# Patient Record
Sex: Female | Born: 1935 | Race: White | Hispanic: No | Smoking: Never smoker
Health system: Southern US, Community
[De-identification: ages and names within clinical notes are randomized; demographics above are authoritative.]

## PROBLEM LIST (undated history)

## (undated) DIAGNOSIS — G20A1 Parkinson's disease without dyskinesia, without mention of fluctuations: Secondary | ICD-10-CM

## (undated) DIAGNOSIS — G20 Parkinson's disease: Secondary | ICD-10-CM

## (undated) DIAGNOSIS — K922 Gastrointestinal hemorrhage, unspecified: Secondary | ICD-10-CM

## (undated) DIAGNOSIS — K279 Peptic ulcer, site unspecified, unspecified as acute or chronic, without hemorrhage or perforation: Secondary | ICD-10-CM

## (undated) HISTORY — PX: ESOPHAGOGASTRODUODENOSCOPY: SHX1529

---

## 2015-03-02 ENCOUNTER — Encounter (HOSPITAL_COMMUNITY): Payer: Self-pay | Admitting: Emergency Medicine

## 2015-03-02 ENCOUNTER — Emergency Department (HOSPITAL_COMMUNITY): Payer: Medicare Other

## 2015-03-02 ENCOUNTER — Inpatient Hospital Stay (HOSPITAL_COMMUNITY)
Admission: EM | Admit: 2015-03-02 | Discharge: 2015-03-08 | DRG: 065 | Disposition: A | Discharge status: Home Health | Payer: Medicare Other | Attending: Internal Medicine | Admitting: Internal Medicine

## 2015-03-02 DIAGNOSIS — I459 Conduction disorder, unspecified: Secondary | ICD-10-CM | POA: Diagnosis not present

## 2015-03-02 DIAGNOSIS — Z7982 Long term (current) use of aspirin: Secondary | ICD-10-CM

## 2015-03-02 DIAGNOSIS — I63312 Cerebral infarction due to thrombosis of left middle cerebral artery: Secondary | ICD-10-CM | POA: Diagnosis not present

## 2015-03-02 DIAGNOSIS — Q211 Atrial septal defect: Secondary | ICD-10-CM | POA: Diagnosis not present

## 2015-03-02 DIAGNOSIS — I639 Cerebral infarction, unspecified: Secondary | ICD-10-CM | POA: Diagnosis not present

## 2015-03-02 DIAGNOSIS — R29707 NIHSS score 7: Secondary | ICD-10-CM | POA: Diagnosis present

## 2015-03-02 DIAGNOSIS — Z800 Family history of malignant neoplasm of digestive organs: Secondary | ICD-10-CM | POA: Diagnosis not present

## 2015-03-02 DIAGNOSIS — I10 Essential (primary) hypertension: Secondary | ICD-10-CM | POA: Diagnosis present

## 2015-03-02 DIAGNOSIS — Z66 Do not resuscitate: Secondary | ICD-10-CM | POA: Diagnosis present

## 2015-03-02 DIAGNOSIS — I63412 Cerebral infarction due to embolism of left middle cerebral artery: Secondary | ICD-10-CM | POA: Diagnosis present

## 2015-03-02 DIAGNOSIS — R4182 Altered mental status, unspecified: Secondary | ICD-10-CM | POA: Diagnosis present

## 2015-03-02 DIAGNOSIS — R29703 NIHSS score 3: Secondary | ICD-10-CM | POA: Diagnosis not present

## 2015-03-02 DIAGNOSIS — Z801 Family history of malignant neoplasm of trachea, bronchus and lung: Secondary | ICD-10-CM | POA: Diagnosis not present

## 2015-03-02 DIAGNOSIS — G20A1 Parkinson's disease without dyskinesia, without mention of fluctuations: Secondary | ICD-10-CM | POA: Diagnosis present

## 2015-03-02 DIAGNOSIS — R471 Dysarthria and anarthria: Secondary | ICD-10-CM | POA: Diagnosis present

## 2015-03-02 DIAGNOSIS — K279 Peptic ulcer, site unspecified, unspecified as acute or chronic, without hemorrhage or perforation: Secondary | ICD-10-CM | POA: Diagnosis present

## 2015-03-02 DIAGNOSIS — G20 Parkinson's disease: Secondary | ICD-10-CM | POA: Diagnosis present

## 2015-03-02 DIAGNOSIS — K219 Gastro-esophageal reflux disease without esophagitis: Secondary | ICD-10-CM | POA: Diagnosis present

## 2015-03-02 DIAGNOSIS — R490 Dysphonia: Secondary | ICD-10-CM | POA: Diagnosis not present

## 2015-03-02 DIAGNOSIS — R479 Unspecified speech disturbances: Secondary | ICD-10-CM | POA: Diagnosis present

## 2015-03-02 DIAGNOSIS — I340 Nonrheumatic mitral (valve) insufficiency: Secondary | ICD-10-CM | POA: Diagnosis not present

## 2015-03-02 DIAGNOSIS — R4701 Aphasia: Secondary | ICD-10-CM | POA: Diagnosis present

## 2015-03-02 HISTORY — DX: Parkinson's disease without dyskinesia, without mention of fluctuations: G20.A1

## 2015-03-02 HISTORY — DX: Parkinson's disease: G20

## 2015-03-02 HISTORY — DX: Gastrointestinal hemorrhage, unspecified: K92.2

## 2015-03-02 HISTORY — DX: Peptic ulcer, site unspecified, unspecified as acute or chronic, without hemorrhage or perforation: K27.9

## 2015-03-02 LAB — CBC WITH DIFFERENTIAL/PLATELET
BASOS PCT: 1 %
Basophils Absolute: 0.1 10*3/uL (ref 0.0–0.1)
EOS ABS: 0.1 10*3/uL (ref 0.0–0.7)
EOS PCT: 1 %
HEMATOCRIT: 40 % (ref 36.0–46.0)
Hemoglobin: 13.4 g/dL (ref 12.0–15.0)
Lymphocytes Relative: 25 %
Lymphs Abs: 1.8 10*3/uL (ref 0.7–4.0)
MCH: 32 pg (ref 26.0–34.0)
MCHC: 33.5 g/dL (ref 30.0–36.0)
MCV: 95.5 fL (ref 78.0–100.0)
MONO ABS: 0.5 10*3/uL (ref 0.1–1.0)
MONOS PCT: 7 %
NEUTROS ABS: 4.6 10*3/uL (ref 1.7–7.7)
Neutrophils Relative %: 66 %
PLATELETS: 228 10*3/uL (ref 150–400)
RBC: 4.19 MIL/uL (ref 3.87–5.11)
RDW: 12.2 % (ref 11.5–15.5)
WBC: 6.9 10*3/uL (ref 4.0–10.5)

## 2015-03-02 LAB — COMPREHENSIVE METABOLIC PANEL
ALBUMIN: 4 g/dL (ref 3.5–5.0)
ALK PHOS: 69 U/L (ref 38–126)
ALT: 9 U/L — AB (ref 14–54)
ANION GAP: 9 (ref 5–15)
AST: 18 U/L (ref 15–41)
BILIRUBIN TOTAL: 1.2 mg/dL (ref 0.3–1.2)
BUN: 14 mg/dL (ref 6–20)
CALCIUM: 9.2 mg/dL (ref 8.9–10.3)
CO2: 26 mmol/L (ref 22–32)
CREATININE: 0.67 mg/dL (ref 0.44–1.00)
Chloride: 106 mmol/L (ref 101–111)
GFR calc Af Amer: >60 mL/min (ref >60)
GFR calc non Af Amer: >60 mL/min (ref >60)
GLUCOSE: 89 mg/dL (ref 65–99)
Potassium: 4 mmol/L (ref 3.5–5.1)
Sodium: 141 mmol/L (ref 135–145)
TOTAL PROTEIN: 6.9 g/dL (ref 6.5–8.1)

## 2015-03-02 LAB — I-STAT TROPONIN, ED: Troponin i, poc: 0 ng/mL (ref 0.00–0.08)

## 2015-03-02 MED ORDER — PANTOPRAZOLE SODIUM 40 MG IV SOLR
40.0000 mg | INTRAVENOUS | Status: DC
  Administered 2015-03-03 – 2015-03-04 (×2): 40 mg via INTRAVENOUS
  Filled 2015-03-02 (×2): qty 40

## 2015-03-02 MED ORDER — STROKE: EARLY STAGES OF RECOVERY BOOK
Freq: Once | Status: DC
  Filled 2015-03-02: qty 1

## 2015-03-02 MED ORDER — CARBIDOPA-LEVODOPA 25-100 MG PO TABS
1.0000 | ORAL_TABLET | Freq: Every day | ORAL | Status: DC
  Administered 2015-03-03 – 2015-03-08 (×6): 1 via ORAL
  Filled 2015-03-02 (×6): qty 1

## 2015-03-02 MED ORDER — ASPIRIN 325 MG PO TABS
325.0000 mg | ORAL_TABLET | Freq: Every day | ORAL | Status: DC
  Administered 2015-03-03 – 2015-03-08 (×6): 325 mg via ORAL
  Filled 2015-03-02 (×6): qty 1

## 2015-03-02 MED ORDER — HEPARIN SODIUM (PORCINE) 5000 UNIT/ML IJ SOLN
5000.0000 [IU] | Freq: Three times a day (TID) | INTRAMUSCULAR | Status: DC
  Administered 2015-03-03 (×3): 5000 [IU] via SUBCUTANEOUS
  Filled 2015-03-02 (×5): qty 1

## 2015-03-02 MED ORDER — ASPIRIN 300 MG RE SUPP: 300.0000 mg | Freq: Every day | RECTAL | Status: DC

## 2015-03-02 MED ORDER — ASPIRIN 81 MG PO CHEW
324.0000 mg | CHEWABLE_TABLET | Freq: Once | ORAL | Status: AC
  Administered 2015-03-02: 324 mg via ORAL
  Filled 2015-03-02: qty 4

## 2015-03-02 MED ORDER — SENNOSIDES-DOCUSATE SODIUM 8.6-50 MG PO TABS: 1.0000 | ORAL_TABLET | Freq: Every evening | ORAL | Status: DC | PRN

## 2015-03-02 MED ORDER — CHOLECALCIFEROL 10 MCG (400 UNIT) PO TABS
400.0000 [IU] | ORAL_TABLET | Freq: Every day | ORAL | Status: DC
  Administered 2015-03-03 – 2015-03-08 (×6): 400 [IU] via ORAL
  Filled 2015-03-02 (×6): qty 1

## 2015-03-02 NOTE — ED Notes (Signed)
EKG given to EDP,Messner,MD., for review. 

## 2015-03-02 NOTE — Progress Notes (Signed)
Patient presents to Ed for "having a hard time getting her words out."  [facility].  Patient lives at home alone.  Patient's daughter lives directly behind the patient.  Patient reports she is able to perform her ADL's on her own.  Patient ambulates with a cane and does well per patient.  Patient reports she cooks for herself and her daughter brings her food occassionally. Patient reports her daughter takes her to her doctor's appointments and assists with her medications.   Patient reports her pcp is Dr. Clarene DukeLittle.  Patient reports she does nto require further dme at this time.  EDCM provided patient with [place], explained services.  EDCM also provided patient with list of private duty nursing agencies and explained it would be an out of pocket expense.  EDCM explained if patient required home health services at a later date, she may do so through her pcp.  Patient and patient's daughter thankful for services.  No further EDCM needs at this time.

## 2015-03-02 NOTE — ED Provider Notes (Signed)
CSN: [id number]     Arrival date & time 03/02/15  1649 History   First MD Initiated Contact with Patient 03/02/15 2024     Chief Complaint  Patient presents with  . Altered Mental Status     (Consider location/radiation/quality/duration/timing/severity/associated sxs/prior Treatment) HPI Comments: 82 year old female with history of Parkinson disease presents with her daughters for speech changes. The patient was reportedly seen this morning at 10:30 AM in her normal health by her daughter who lives behind her. When the daughter returned to check on her mother around 2 or 2:30 she noted that the patient was having difficulty communicating and was having difficulty finding words that she wanted to explain things. She also noted that the patient seemed to be saying things that didn't make sense. The patient agrees that she has been having some difficulty finding words. She denies any other symptoms. No headache. The patient lives alone and still does all of her own pills and all of her activities of daily living. No recent illness, fevers, chills, nausea, vomiting. Patient has been ambulating at her baseline.   Past Medical History  Diagnosis Date  . Parkinson disease (HCC)    History reviewed. No pertinent past surgical history. No family history on file. Social History  Substance Use Topics  . Smoking status: Never Smoker   . Smokeless tobacco: None  . Alcohol Use: No   OB History    No data available     Review of Systems  Constitutional: Negative for fever, chills, appetite change and fatigue.  HENT: Negative for congestion, sinus pressure and sore throat.   Eyes: Negative for pain and redness.  Respiratory: Negative for cough, chest tightness and shortness of breath.   Cardiovascular: Negative for chest pain and palpitations.  Gastrointestinal: Negative for nausea, vomiting, abdominal pain and diarrhea.  Genitourinary: Negative for dysuria and urgency.  Musculoskeletal:  Negative for myalgias and back pain.  Skin: Negative for rash.  Neurological: Positive for speech difficulty. Negative for dizziness, seizures, syncope, weakness, light-headedness, numbness and headaches.  Hematological: Does not bruise/bleed easily.      Allergies  Review of patient's allergies indicates no known allergies.  Home Medications   Prior to Admission medications   Medication Sig Start Date End Date Taking? Authorizing Provider  acetaminophen (TYLENOL) 325 MG tablet Take 325-650 mg by mouth every 6 (six) hours as needed for moderate pain.   Yes Historical Provider, MD  carbidopa-levodopa (SINEMET IR) 25-100 MG tablet Take 1 tablet by mouth daily.   Yes Historical Provider, MD  Cholecalciferol (VITAMIN D PO) Take 1 tablet by mouth daily.   Yes Historical Provider, MD   BP 154/67 mmHg  Pulse 83  Temp(Src) 98.8 F (37.1 C) (Oral)  Resp 16  SpO2 97% Physical Exam  Constitutional: She is oriented to person, place, and time. She appears well-developed and well-nourished. No distress.  HENT:  Head: Normocephalic and atraumatic.  Right Ear: External ear normal.  Left Ear: External ear normal.  Nose: Nose normal.  Mouth/Throat: Oropharynx is clear and moist. No oropharyngeal exudate.  Eyes: EOM are normal. Pupils are equal, round, and reactive to light.  Neck: Normal range of motion. Neck supple.  Cardiovascular: Normal rate, regular rhythm, normal heart sounds and intact distal pulses.   No murmur heard. Pulmonary/Chest: Effort normal. No respiratory distress. She has no wheezes. She has no rales.  Abdominal: Soft. She exhibits no distension. There is no tenderness.  Musculoskeletal: Normal range of motion. She exhibits no edema or  tenderness.  Neurological: She is alert and oriented to person, place, and time.  Patient intermittently not making sense when speaking. Able to answer complete sentences. Neurologic examination somewhat limited. Patient with severe resting  tremor in her upper extremities. Able to grip both hands symmetrically. Able to hold legs off of the bed.  Skin: Skin is warm and dry. No rash noted. She is not diaphoretic.  Vitals reviewed.   ED Course  Procedures (including critical care time) Labs Review Labs Reviewed  COMPREHENSIVE METABOLIC PANEL - Abnormal; Notable for the following:    ALT 9 (*)    All other components within normal limits  CBC WITH DIFFERENTIAL/PLATELET  URINALYSIS, ROUTINE W REFLEX MICROSCOPIC (NOT AT [facility])  Rosezena Sensor, ED    Imaging Review Dg Chest 2 View  03/02/2015  CLINICAL DATA:  Altered mental status EXAM: CHEST  2 VIEW COMPARISON:  None. FINDINGS: The heart is enlarged. Hiatal hernia is present. There are no focal consolidations or pleural effusions. No pulmonary edema. IMPRESSION: Cardiomegaly.  Hiatal hernia. Electronically Signed   By: Norva Pavlov M.D.   On: 03/02/2015 21:43   Ct Head Wo Contrast  03/02/2015  CLINICAL DATA:  82 year old female with Parkinson's presenting with change in mental status and difficulty with speech EXAM: CT HEAD WITHOUT CONTRAST TECHNIQUE: Contiguous axial images were obtained from the base of the skull through the vertex without intravenous contrast. COMPARISON:  None FINDINGS: The ventricles and sulci are appropriate in size for patient's age. Mild periventricular and deep white matter hypodensities represent chronic microvascular ischemic changes. There is a focal area of hypodensity in the posterior left temporal lobe compatible with an acute or subacute infarct. Clinical correlation is recommended. There is no intracranial hemorrhage. No mass effect or midline shift identified. The visualized paranasal sinuses and mastoid air cells are well aerated. The calvarium is intact. IMPRESSION: Focal left posterior temporal lobe hypodensity compatible with an infarct, likely acute or subacute. Clinical correlation is recommended. CT angiography or MRI is recommended for  better evaluation. No acute intracranial hemorrhage. Critical Value/emergent results were called by telephone at the time of interpretation on 03/02/2015 at 10:40 pm to Dr. Tyrone Apple , who verbally acknowledged these results. Electronically Signed   By: Elgie Collard M.D.   On: 03/02/2015 22:40   I have personally reviewed and evaluated these images and lab results as part of my medical decision-making.   EKG Interpretation   Date/Time:  Wednesday March 02 2015 21:58:37 EST Ventricular Rate:  145 PR Interval:  168 QRS Duration: 115 QT Interval:  421 QTC Calculation: 654 R Axis:   81 Text Interpretation:  Supraventricular tachycardia Ventricular  tachycardia, unsustained Incomplete right bundle branch block Borderline  low voltage, extremity leads Artifact vs tachycardia, no previous tracings  available Confirmed by NGUYEN, EMILY ([id number]) on 03/02/2015 10:04:06 PM      MDM  Patient seen and evaluated in stable condition. Mild difficulty with speech noted. Speech not slurred. Face symmetric. No other focal neurologic findings found. Of note the patient was in triage for about 3 and half hours before being brought back to a treatment room. EKG difficult to interpret secondary to lots of artifact from her tremor but rapid heart rate on monitor does not correspond with the rate that you can palpate and hear on examination.  Labs were unremarkable.  CT head showed acute/subacute infarct.  Discussed with Dr. Hosie Poisson who agreed with treatment for ASA.  He recommended medical admission and said that he would  see the patient once they were brought over to [facility].  Discussed with Dr. Clyde Lundborg who agreed with admission.  The patient was admitted under the care of Dr. Clyde Lundborg to telemetry over at [facility].  Patient and family were updated on results and plan of care. Final diagnoses:  Cerebral infarction due to unspecified mechanism    1. CVA    Leta Baptist, MD 03/02/15 2324

## 2015-03-02 NOTE — ED Notes (Signed)
Daughter states yesterday mother was fine, lives by herself. Now pt is having a hard time getting her words out per daughter. Pt states she feels fine, daughter staes other than pts words she has not noticed anything else.

## 2015-03-02 NOTE — H&P (Signed)
[facility] History and Physical  Allison Marshall [id number] DOB: 09/25/1926 DOA: 03/02/2015  Referring physician: ED physician PCP: Aida PufferLITTLE,JAMES, MD  Specialists:   Chief Complaint: Difficulty speaking   HPI: Allison BottomsBertha L Soden is a 82 y.o. female with PMH of Parkinson's disease, PUD, GI bleeding 2011, who presents with difficulty speaking.  Per her daughter, patient was last known normal at 10:30 AM, but was noticed to have difficulty speaking at 2:30 PM by her daughter. Patient has difficult to word out. Patient does not seem to have unilateral weakness, numbness or tingling sensations. No vision change or hearing loss. No seizure activities. Patient has hand shaking due to Parkinson's disease, which has not changed. She does not have chest pain, shortness of breath, cough, abdominal pain, diarrhea, symptoms of UTI.  In ED, patient was found to have WBC 6.9, temperature normal, no tachycardia, renal function okay, chest x-ray with no infiltration, but showed hiatal hernia, negative troponin. CT head showed focal left posterior temporal lobe hypodensity compatible with an infarct, likely acute or subacute, no acute intracranial hemorrhage. Patient is admitted to inpatient for further interventional treatment. Neurology was consulted by EDP.  EKG: Independently reviewed.  Due to handshaking secondary to Parkinson's disease. Seems to have artificial baseline change. EKG is hard to be interpreted. QTC 654, transient "SVT'?  Where does patient live?   At home   Can patient participate in ADLs?   None  Review of Systems:   General: no fevers, chills, no changes in body weight, has fatigue HEENT: no blurry vision, hearing changes or sore throat Pulm: no dyspnea, coughing, wheezing CV: no chest pain, palpitations Abd: no nausea, vomiting, abdominal pain, diarrhea, constipation GU: no dysuria, burning on urination, increased urinary frequency, hematuria  Ext: no leg edema. Has had  hand shaking  Neuro: no unilateral weakness, numbness, or tingling, no vision change or hearing loss. Has difficulty speaking. Skin: no rash MSK: No muscle spasm, no deformity, no limitation of range of movement in spin Heme: No easy bruising.  Travel history: No recent long distant travel.  Allergy: No Known Allergies  Past Medical History  Diagnosis Date  . Parkinson disease (HCC)   . GIB (gastrointestinal bleeding)   . PUD (peptic ulcer disease)     History reviewed. No pertinent past surgical history.  Social History:  reports that she has never smoked. She does not have any smokeless tobacco history on file. She reports that she does not drink alcohol. Her drug history is not on file.  Family History:  Family History  Problem Relation Age of Onset  . Lung cancer Mother   . Diabetes Mother   . Pancreatic cancer Father      Prior to Admission medications   Medication Sig Start Date End Date Taking? Authorizing Provider  acetaminophen (TYLENOL) 325 MG tablet Take 325-650 mg by mouth every 6 (six) hours as needed for moderate pain.   Yes Historical Provider, MD  carbidopa-levodopa (SINEMET IR) 25-100 MG tablet Take 1 tablet by mouth daily.   Yes Historical Provider, MD  Cholecalciferol (VITAMIN D PO) Take 1 tablet by mouth daily.   Yes Historical Provider, MD    Physical Exam: Filed Vitals:   03/02/15 1701 03/02/15 2022 03/02/15 2313 03/03/15 0039  BP: 148/83 154/67 154/72 129/88  Pulse: 84 83 93 77  Temp: 98.9 F (37.2 C) 98.8 F (37.1 C)    TempSrc: Oral Oral    Resp: 18 16 22 20   SpO2: 100% 97% 95% 97%  General: Not in acute distress HEENT:       Eyes: PERRL, EOMI, no scleral icterus.       ENT: No discharge from the ears and nose, no pharynx injection, no tonsillar enlargement.        Neck: No JVD, no bruit, no mass felt. Heme: No neck lymph node enlargement. Cardiac: S1/S2, RRR, No murmurs, No gallops or rubs. Pulm:  No rales, wheezing, rhonchi or  rubs. Abd: Soft, nondistended, nontender, no rebound pain, no organomegaly, BS present. Ext: No pitting leg edema bilaterally. 2+DP/PT pulse bilaterally. Musculoskeletal: No joint deformities, No joint redness or warmth, no limitation of ROM in spin. Skin: No rashes.  Neuro: Alert, oriented X3, cranial nerves II-XII grossly intact, muscle strength 4/5 in all extremities, sensation to light touch intact. Knee reflex 1+ bilaterally. Negative Babinski's sign. Psych: Patient is not psychotic, no suicidal or hemocidal ideation.  Labs on Admission:  Basic Metabolic Panel:  Recent Labs Lab 03/02/15 2135  NA 141  K 4.0  CL 106  CO2 26  GLUCOSE 89  BUN 14  CREATININE 0.67  CALCIUM 9.2   Liver Function Tests:  Recent Labs Lab 03/02/15 2135  AST 18  ALT 9*  ALKPHOS 69  BILITOT 1.2  PROT 6.9  ALBUMIN 4.0   No results for input(s): LIPASE, AMYLASE in the last 168 hours. No results for input(s): AMMONIA in the last 168 hours. CBC:  Recent Labs Lab 03/02/15 2135  WBC 6.9  NEUTROABS 4.6  HGB 13.4  HCT 40.0  MCV 95.5  PLT 228   Cardiac Enzymes: No results for input(s): CKTOTAL, CKMB, CKMBINDEX, TROPONINI in the last 168 hours.  BNP (last 3 results) No results for input(s): BNP in the last 8760 hours.  ProBNP (last 3 results) No results for input(s): PROBNP in the last 8760 hours.  CBG: No results for input(s): GLUCAP in the last 168 hours.  Radiological Exams on Admission: Dg Chest 2 View  03/02/2015  CLINICAL DATA:  Altered mental status EXAM: CHEST  2 VIEW COMPARISON:  None. FINDINGS: The heart is enlarged. Hiatal hernia is present. There are no focal consolidations or pleural effusions. No pulmonary edema. IMPRESSION: Cardiomegaly.  Hiatal hernia. Electronically Signed   By: Norva Pavlov M.D.   On: 03/02/2015 21:43   Ct Head Wo Contrast  03/02/2015  CLINICAL DATA:  82 year old female with Parkinson's presenting with change in mental status and difficulty with  speech EXAM: CT HEAD WITHOUT CONTRAST TECHNIQUE: Contiguous axial images were obtained from the base of the skull through the vertex without intravenous contrast. COMPARISON:  None FINDINGS: The ventricles and sulci are appropriate in size for patient's age. Mild periventricular and deep white matter hypodensities represent chronic microvascular ischemic changes. There is a focal area of hypodensity in the posterior left temporal lobe compatible with an acute or subacute infarct. Clinical correlation is recommended. There is no intracranial hemorrhage. No mass effect or midline shift identified. The visualized paranasal sinuses and mastoid air cells are well aerated. The calvarium is intact. IMPRESSION: Focal left posterior temporal lobe hypodensity compatible with an infarct, likely acute or subacute. Clinical correlation is recommended. CT angiography or MRI is recommended for better evaluation. No acute intracranial hemorrhage. Critical Value/emergent results were called by telephone at the time of interpretation on 03/02/2015 at 10:40 pm to Dr. Tyrone Apple , who verbally acknowledged these results. Electronically Signed   By: Elgie Collard M.D.   On: 03/02/2015 22:40    Assessment/Plan Principal Problem:  Cerebral infarction due to unspecified mechanism Active Problems:   PUD (peptic ulcer disease)   Difficulty speaking   Parkinson disease (HCC)   Stroke: patient's difficulty speaking is due to stroke as evidenced by CT head. No intracranial bleeding. Patient does not have extremity weakness. Hemodynamically stable. Neurology was consulted by EDP  - will admit to tele bed - follow-up neurology's recommendation - Risk factor modification: HgbA1c, fasting lipid panel  - MRI, MRA of the brain without contrast  - PT consult, OT consult, Speech consult  - 2 d Echocardiogram  - Ekg  - Carotid dopplers  - Aspirin   Hx of GIB 2/2 PUD (peptic ulcer disease): stable. No on meds at home -will  start Protonix since pt will be on ASA  Parkinson disease (HCC): stable -continue Sinemet  DVT ppx: SQ Heparin   Code Status: Full code Family Communication:  Yes, patient's 2 daughters and son at bed side Disposition Plan: Admit to inpatient   Date of Service 03/03/2015    Lorretta Harp [facility] Pager [phone]  If 7PM-7AM, please contact night-coverage [email or web address] Password [email or web address] 03/03/2015, 1:10 AM

## 2015-03-02 NOTE — ED Notes (Signed)
Patient transported to X-ray 

## 2015-03-02 NOTE — ED Notes (Signed)
Dr. Jarvis MorganX. Niu at bedside.

## 2015-03-02 NOTE — ED Notes (Signed)
Delay in lab draw,  Pt not in room at this time. 

## 2015-03-03 ENCOUNTER — Encounter (HOSPITAL_COMMUNITY): Payer: Self-pay | Admitting: Internal Medicine

## 2015-03-03 ENCOUNTER — Inpatient Hospital Stay (HOSPITAL_COMMUNITY): Payer: Medicare Other

## 2015-03-03 DIAGNOSIS — G20 Parkinson's disease: Secondary | ICD-10-CM

## 2015-03-03 DIAGNOSIS — R490 Dysphonia: Secondary | ICD-10-CM

## 2015-03-03 DIAGNOSIS — I63312 Cerebral infarction due to thrombosis of left middle cerebral artery: Secondary | ICD-10-CM

## 2015-03-03 DIAGNOSIS — I639 Cerebral infarction, unspecified: Secondary | ICD-10-CM

## 2015-03-03 LAB — URINALYSIS, ROUTINE W REFLEX MICROSCOPIC
Bilirubin Urine: NEGATIVE
Glucose, UA: NEGATIVE mg/dL
Hgb urine dipstick: NEGATIVE
KETONES UR: 15 mg/dL — AB
NITRITE: NEGATIVE
PH: 5.5 (ref 5.0–8.0)
PROTEIN: NEGATIVE mg/dL
SPECIFIC GRAVITY, URINE: 1.02 (ref 1.005–1.030)

## 2015-03-03 LAB — LIPID PANEL
CHOL/HDL RATIO: 3.1 ratio
Cholesterol: 158 mg/dL (ref 0–200)
HDL: 51 mg/dL (ref >40)
LDL Cholesterol: 96 mg/dL (ref 0–99)
TRIGLYCERIDES: 57 mg/dL (ref <150)
VLDL: 11 mg/dL (ref 0–40)

## 2015-03-03 LAB — PROTIME-INR
INR: 1.17 (ref 0.00–1.49)
PROTHROMBIN TIME: 15.1 s (ref 11.6–15.2)

## 2015-03-03 LAB — URINE MICROSCOPIC-ADD ON

## 2015-03-03 LAB — APTT: aPTT: 36 seconds (ref 24–37)

## 2015-03-03 MED ORDER — ENOXAPARIN SODIUM 40 MG/0.4ML ~~LOC~~ SOLN
40.0000 mg | SUBCUTANEOUS | Status: DC
  Administered 2015-03-03 – 2015-03-07 (×5): 40 mg via SUBCUTANEOUS
  Filled 2015-03-03 (×6): qty 0.4

## 2015-03-03 NOTE — ED Notes (Addendum)
Per MRI tech who spoke to MD Clyde LundborgNiu, pt will need neuro consult before MRI due to tremors from Parkinson dx.  Will pass along to next RN on shift to call MRI after neuro consult or if pt goes to [facility].  MRI tech reports MD Clyde Lundborgiu said pt may need to be sedated with anaesthesia to get the MRI due to tremors.

## 2015-03-03 NOTE — Progress Notes (Signed)
PT Cancellation Note  Patient Details Name: Peyton BottomsBertha L Friebel MRN: [id number] DOB: 12/08/1926   Cancelled Treatment:    Reason Eval/Treat Not Completed: Patient not medically ready (transferring to [facility])   Rada HayHill, Danish Ruffins Elizabeth 03/03/2015, 3:33 PM Blanchard KelchKaren Aalyssa Elderkin PT [phone]

## 2015-03-03 NOTE — Progress Notes (Signed)
[facility] ED CM consulted by Admission RN Kim to inquire about daughter wanting to get pt a new pcp  EPIC lists Dr Aida Pufferjames Little as pcp CM recommended daughter go to medicare.gov to find listing of in network doctors or to call medicare toll free number to provide zip code to find list of medicare providers

## 2015-03-03 NOTE — ED Notes (Signed)
[facility]

## 2015-03-03 NOTE — ED Notes (Signed)
Dr. Darnelle Catalanama returned call regarding MRI not being able to be completed due to shaking from Parkinson's. No new orders.

## 2015-03-03 NOTE — ED Notes (Signed)
Per MRI, Pt will have to be given Ativan prior to imaging.  However, Niu MD would not order the Ativan w/o a Neuro consult.  MRI reports that they would be able to complete imaging w/ sedation.

## 2015-03-03 NOTE — Consult Note (Signed)
Admission H&P    Chief Complaint: New onset speech output difficulty.  HPI: Allison Marshall is an 82 y.o. female with history of Parkinson's disease as well as peptic ulcer disease with upper GI hemorrhage, brought to the ED following acute onset of speech difficulty. Patient had difficulty formulating words as well as unintelligible speech at times. She had no difficulty understanding what was being said to her. She has no previous history of stroke nor TIA. There was no focal weakness. She has not been on antiplatelet therapy. CT scan of the head showed no focal left frontotemporal hypodensity, likely acute ischemic stroke. NIH stroke score was 3.  LSN: 9 AM on 03/02/2015 tPA Given: No: Beyond time under for treatment consideration on arrival in the ED mRankin:  Past Medical History  Diagnosis Date  . Parkinson disease ([place])   . GIB (gastrointestinal bleeding)   . PUD (peptic ulcer disease)     History reviewed. No pertinent past surgical history.  Family History  Problem Relation Age of Onset  . Lung cancer Mother   . Diabetes Mother   . Pancreatic cancer Father    Social History:  reports that she has never smoked. She does not have any smokeless tobacco history on file. She reports that she does not drink alcohol. Her drug history is not on file.  Allergies: No Known Allergies  Medications: Preadmission medications were reviewed by me.  ROS: History obtained from patient's daughter.  General ROS: negative for - chills, fatigue, fever, night sweats, weight gain or weight loss Psychological ROS: negative for - behavioral disorder, hallucinations, memory difficulties, mood swings or suicidal ideation Ophthalmic ROS: negative for - blurry vision, double vision, eye pain or loss of vision ENT ROS: negative for - epistaxis, nasal discharge, oral lesions, sore throat, tinnitus or vertigo Allergy and Immunology ROS: negative for - hives or itchy/watery eyes Hematological and  Lymphatic ROS: negative for - bleeding problems, bruising or swollen lymph nodes Endocrine ROS: negative for - galactorrhea, hair pattern changes, polydipsia/polyuria or temperature intolerance Respiratory ROS: negative for - cough, hemoptysis, shortness of breath or wheezing Cardiovascular ROS: negative for - chest pain, dyspnea on exertion, edema or irregular heartbeat Gastrointestinal ROS: negative for - abdominal pain, diarrhea, hematemesis, nausea/vomiting or stool incontinence Genito-Urinary ROS: negative for - dysuria, hematuria, incontinence or urinary frequency/urgency Musculoskeletal ROS: negative for - joint swelling or muscular weakness Neurological ROS: as noted in HPI Dermatological ROS: negative for rash and skin lesion changes  Physical Examination: Blood pressure 125/28, pulse 81, temperature 98 F (36.7 C), temperature source Oral, resp. rate 14, SpO2 100 %.  HEENT-  Normocephalic, no lesions, without obvious abnormality.  Normal external eye and conjunctiva.  Normal TM's bilaterally.  Normal auditory canals and external ears. Normal external nose, mucus membranes and septum.  Normal pharynx. Neck supple with no masses, nodes, nodules or enlargement. Cardiovascular - regular rate and rhythm, S1, S2 normal, no murmur, click, rub or gallop Lungs - chest clear, no wheezing, rales, normal symmetric air entry Abdomen - soft, non-tender; bowel sounds normal; no masses,  no organomegaly Extremities - no joint deformities, effusion, or inflammation and no edema  Neurologic Examination: Mental Status: Alert, with moderately severe expressive aphasia, including word salad at times. Able to follow commands without difficulty. Cranial Nerves: II-Visual fields were normal. III/IV/VI-Pupils were equal and reacted normally to light. Extraocular movements were full and conjugate.    V/VII-no facial numbness and no facial weakness. VIII-normal. X-moderate dysarthria, commensurate with  edentulous  state. XI: trapezius strength/neck flexion strength normal bilaterally XII-midline tongue extension with normal strength. Motor: 5/5 bilaterally with normal tone and bulk; moderate bilateral resting tremor of upper extremities with slight increase in muscle tone throughout. Sensory: Normal throughout. Deep Tendon Reflexes: Trace only and symmetric. Plantars: Mute bilaterally Cerebellar: Normal finger-to-nose testing. Carotid auscultation: Normal  Results for orders placed or performed during the hospital encounter of 03/02/15 (from the past 48 hour(s))  Protime-INR     Status: None   Collection Time: 03/02/15  9:34 PM  Result Value Ref Range   Prothrombin Time 15.1 11.6 - 15.2 seconds   INR 1.17 0.00 - 1.49  APTT     Status: None   Collection Time: 03/02/15  9:34 PM  Result Value Ref Range   aPTT 36 24 - 37 seconds  CBC with Differential     Status: None   Collection Time: 03/02/15  9:35 PM  Result Value Ref Range   WBC 6.9 4.0 - 10.5 K/uL   RBC 4.19 3.87 - 5.11 MIL/uL   Hemoglobin 13.4 12.0 - 15.0 g/dL   HCT 40.0 36.0 - 46.0 %   MCV 95.5 78.0 - 100.0 fL   MCH 32.0 26.0 - 34.0 pg   MCHC 33.5 30.0 - 36.0 g/dL   RDW 12.2 11.5 - 15.5 %   Platelets 228 150 - 400 K/uL   Neutrophils Relative % 66 %   Neutro Abs 4.6 1.7 - 7.7 K/uL   Lymphocytes Relative 25 %   Lymphs Abs 1.8 0.7 - 4.0 K/uL   Monocytes Relative 7 %   Monocytes Absolute 0.5 0.1 - 1.0 K/uL   Eosinophils Relative 1 %   Eosinophils Absolute 0.1 0.0 - 0.7 K/uL   Basophils Relative 1 %   Basophils Absolute 0.1 0.0 - 0.1 K/uL  Comprehensive metabolic panel     Status: Abnormal   Collection Time: 03/02/15  9:35 PM  Result Value Ref Range   Sodium 141 135 - 145 mmol/L   Potassium 4.0 3.5 - 5.1 mmol/L   Chloride 106 101 - 111 mmol/L   CO2 26 22 - 32 mmol/L   Glucose, Bld 89 65 - 99 mg/dL   BUN 14 6 - 20 mg/dL   Creatinine, Ser 0.67 0.44 - 1.00 mg/dL   Calcium 9.2 8.9 - 10.3 mg/dL   Total Protein 6.9 6.5  - 8.1 g/dL   Albumin 4.0 3.5 - 5.0 g/dL   AST 18 15 - 41 U/L   ALT 9 (L) 14 - 54 U/L   Alkaline Phosphatase 69 38 - 126 U/L   Total Bilirubin 1.2 0.3 - 1.2 mg/dL   GFR calc non Af Amer >60 >60 mL/min   GFR calc Af Amer >60 >60 mL/min    Comment: (NOTE) The eGFR has been calculated using the CKD EPI equation. This calculation has not been validated in all clinical situations. eGFR's persistently <60 mL/min signify possible Chronic Kidney Disease.    Anion gap 9 5 - 15  I-Stat Troponin, ED (not at [facility])     Status: None   Collection Time: 03/02/15  9:41 PM  Result Value Ref Range   Troponin i, poc 0.00 0.00 - 0.08 ng/mL   Comment 3            Comment: Due to the release kinetics of cTnI, a negative result within the first hours of the onset of symptoms does not rule out myocardial infarction with certainty. If myocardial infarction is still suspected,  repeat the test at appropriate intervals.   Urinalysis, Routine w reflex microscopic (not at [facility])     Status: Abnormal   Collection Time: 03/03/15  1:53 AM  Result Value Ref Range   Color, Urine YELLOW YELLOW   APPearance CLEAR CLEAR   Specific Gravity, Urine 1.020 1.005 - 1.030   pH 5.5 5.0 - 8.0   Glucose, UA NEGATIVE NEGATIVE mg/dL   Hgb urine dipstick NEGATIVE NEGATIVE   Bilirubin Urine NEGATIVE NEGATIVE   Ketones, ur 15 (A) NEGATIVE mg/dL   Protein, ur NEGATIVE NEGATIVE mg/dL   Nitrite NEGATIVE NEGATIVE   Leukocytes, UA SMALL (A) NEGATIVE  Urine microscopic-add on     Status: Abnormal   Collection Time: 03/03/15  1:53 AM  Result Value Ref Range   Squamous Epithelial / LPF 6-30 (A) NONE SEEN   WBC, UA 6-30 0 - 5 WBC/hpf   RBC / HPF 0-5 0 - 5 RBC/hpf   Bacteria, UA FEW (A) NONE SEEN  Lipid panel     Status: None   Collection Time: 03/03/15  5:12 AM  Result Value Ref Range   Cholesterol 158 0 - 200 mg/dL   Triglycerides 57 <150 mg/dL   HDL 51 >40 mg/dL   Total CHOL/HDL Ratio 3.1 RATIO   VLDL 11 0 - 40 mg/dL   LDL  Cholesterol 96 0 - 99 mg/dL    Comment:        Total Cholesterol/HDL:CHD Risk Coronary Heart Disease Risk Table                     Men   Women  1/2 Average Risk   3.4   3.3  Average Risk       5.0   4.4  2 X Average Risk   9.6   7.1  3 X Average Risk  23.4   11.0        Use the calculated Patient Ratio above and the CHD Risk Table to determine the patient's CHD Risk.        ATP III CLASSIFICATION (LDL):  <100     mg/dL   Optimal  100-129  mg/dL   Near or Above                    Optimal  130-159  mg/dL   Borderline  160-189  mg/dL   High  >190     mg/dL   Very High Performed at [facility]    Dg Chest 2 View  03/02/2015  CLINICAL DATA:  Altered mental status EXAM: CHEST  2 VIEW COMPARISON:  None. FINDINGS: The heart is enlarged. Hiatal hernia is present. There are no focal consolidations or pleural effusions. No pulmonary edema. IMPRESSION: Cardiomegaly.  Hiatal hernia. Electronically Signed   By: Nolon Nations M.D.   On: 03/02/2015 21:43   Ct Head Wo Contrast  03/02/2015  CLINICAL DATA:  82 year old female with Parkinson's presenting with change in mental status and difficulty with speech EXAM: CT HEAD WITHOUT CONTRAST TECHNIQUE: Contiguous axial images were obtained from the base of the skull through the vertex without intravenous contrast. COMPARISON:  None FINDINGS: The ventricles and sulci are appropriate in size for patient's age. Mild periventricular and deep white matter hypodensities represent chronic microvascular ischemic changes. There is a focal area of hypodensity in the posterior left temporal lobe compatible with an acute or subacute infarct. Clinical correlation is recommended. There is no intracranial hemorrhage. No mass effect or midline  shift identified. The visualized paranasal sinuses and mastoid air cells are well aerated. The calvarium is intact. IMPRESSION: Focal left posterior temporal lobe hypodensity compatible with an infarct, likely acute or  subacute. Clinical correlation is recommended. CT angiography or MRI is recommended for better evaluation. No acute intracranial hemorrhage. Critical Value/emergent results were called by telephone at the time of interpretation on 03/02/2015 at 10:40 pm to Dr. Lonia Skinner , who verbally acknowledged these results. Electronically Signed   By: Anner Crete M.D.   On: 03/02/2015 22:40    Assessment: 82 y.o. female presenting with left MCA territory acute cerebral infarction with moderately severe expressive aphasia.  Stroke Risk Factors - none  Plan: 1. HgbA1c, fasting lipid panel 2. MRI, MRA  of the brain without contrast 3. PT consult, OT consult, Speech consult 4. Echocardiogram 5. Carotid dopplers 6. Prophylactic therapy-Antiplatelet med: Aspirin  7. Risk factor modification 8. Telemetry monitoring  C.R. Nicole Kindred, MD [facility] [phone]  03/03/2015, 9:32 AM

## 2015-03-03 NOTE — Progress Notes (Signed)
Progress Note   Allison Marshall ZOX:[id number] DOB: 07-Jan-1927 DOA: 03/02/2015 PCP: [name], MD   Brief Narrative:   Allison Marshall is an 82 y.o. female with a PMH of Parkinson's disease, peptic ulcer disease/GI bleeding who was admitted 03/02/15 with a chief complaint of dysarthria. CT of the head showed a focal left posterior temporal lobe hypodensity compatible with infarction. Neurology consulted.  Assessment/Plan:   Principal Problem:   Cerebral infarction due to unspecified mechanism/dysarthria - Awaiting transfer to stroke unit. - MRI/MRA unable to be obtained secondary to the patient's severe parkinsonism and resting tremor. - CT/OT/speech therapy consultations requested. - Follow-up 2-D echocardiogram and carotid dopplers. - Follow-up hemoglobin A1c and resting lipid panel. - Continue aspirin and risk therapy modification. - Monitor on telemetry.  Active Problems:   PUD (peptic ulcer disease) - Place on PPI given aspirin therapy.    Parkinson disease (HCC) - Continue Sinemet.    DVT Prophylaxis - Lovenox ordered.   Family Communication/Anticipated D/C date and plan/Code Status   Family Communication: No family currently at the bedside. Disposition Plan: Lives alone, from home. Suspect will need a SNF level of care. Anticipated D/C date:   1-2 days. Code Status: DO NOT RESUSCITATE.   IV Access:    Peripheral IV   Procedures and diagnostic studies:   Dg Chest 2 View  03/02/2015  CLINICAL DATA:  Altered mental status EXAM: CHEST  2 VIEW COMPARISON:  None. FINDINGS: The heart is enlarged. Hiatal hernia is present. There are no focal consolidations or pleural effusions. No pulmonary edema. IMPRESSION: Cardiomegaly.  Hiatal hernia. Electronically Signed   By: Norva Pavlov M.D.   On: 03/02/2015 21:43   Ct Head Wo Contrast  03/02/2015  CLINICAL DATA:  82 year old female with Parkinson's presenting with change in mental status and difficulty  with speech EXAM: CT HEAD WITHOUT CONTRAST TECHNIQUE: Contiguous axial images were obtained from the base of the skull through the vertex without intravenous contrast. COMPARISON:  None FINDINGS: The ventricles and sulci are appropriate in size for patient's age. Mild periventricular and deep white matter hypodensities represent chronic microvascular ischemic changes. There is a focal area of hypodensity in the posterior left temporal lobe compatible with an acute or subacute infarct. Clinical correlation is recommended. There is no intracranial hemorrhage. No mass effect or midline shift identified. The visualized paranasal sinuses and mastoid air cells are well aerated. The calvarium is intact. IMPRESSION: Focal left posterior temporal lobe hypodensity compatible with an infarct, likely acute or subacute. Clinical correlation is recommended. CT angiography or MRI is recommended for better evaluation. No acute intracranial hemorrhage. Critical Value/emergent results were called by telephone at the time of interpretation on 03/02/2015 at 10:40 pm to Dr. Tyrone Apple , who verbally acknowledged these results. Electronically Signed   By: Elgie Collard M.D.   On: 03/02/2015 22:40     Medical Consultants:    Neurology: Dr. Noel Christmas  Anti-Infectives:   Anti-infectives    None      Subjective:   Allison Marshall continues to have some word finding difficulty. She is without complaints of headache or paresthesias.  Objective:    Filed Vitals:   03/03/15 1300 03/03/15 1400 03/03/15 1435 03/03/15 1436  BP: 119/86 122/79 102/73 102/73  Pulse:   80 81  Temp:    98 F (36.7 C)  TempSrc:    Oral  Resp: 13 19 18 20   SpO2:   98% 100%   No intake or  output data in the 24 hours ending 03/03/15 1456 There were no vitals filed for this visit.  Exam: Gen:  NAD Cardiovascular:  RRR, No M/R/G Respiratory:  Lungs CTAB Gastrointestinal:  Abdomen soft, NT/ND, + BS Extremities:  No  C/E/C Neuro: Alert with expressive aphasia, able to follow commands, generalized weakness, cranial nerves II-12 grossly intact   Data Reviewed:    Labs: Basic Metabolic Panel:  Recent Labs Lab 03/02/15 2135  NA 141  K 4.0  CL 106  CO2 26  GLUCOSE 89  BUN 14  CREATININE 0.67  CALCIUM 9.2   GFR CrCl cannot be calculated (Unknown ideal weight.). Liver Function Tests:  Recent Labs Lab 03/02/15 2135  AST 18  ALT 9*  ALKPHOS 69  BILITOT 1.2  PROT 6.9  ALBUMIN 4.0   No results for input(s): LIPASE, AMYLASE in the last 168 hours. No results for input(s): AMMONIA in the last 168 hours. Coagulation profile  Recent Labs Lab 03/02/15 2134  INR 1.17    CBC:  Recent Labs Lab 03/02/15 2135  WBC 6.9  NEUTROABS 4.6  HGB 13.4  HCT 40.0  MCV 95.5  PLT 228   Cardiac Enzymes: No results for input(s): CKTOTAL, CKMB, CKMBINDEX, TROPONINI in the last 168 hours. BNP (last 3 results) No results for input(s): PROBNP in the last 8760 hours. CBG: No results for input(s): GLUCAP in the last 168 hours. D-Dimer: No results for input(s): DDIMER in the last 72 hours. Hgb A1c: No results for input(s): HGBA1C in the last 72 hours. Lipid Profile:  Recent Labs  03/03/15 0512  CHOL 158  HDL 51  LDLCALC 96  TRIG 57  CHOLHDL 3.1   Thyroid function studies: No results for input(s): TSH, T4TOTAL, T3FREE, THYROIDAB in the last 72 hours.  Invalid input(s): FREET3 Anemia work up: No results for input(s): VITAMINB12, FOLATE, FERRITIN, TIBC, IRON, RETICCTPCT in the last 72 hours. Sepsis Labs:  Recent Labs Lab 03/02/15 2135  WBC 6.9   Microbiology No results found for this or any previous visit (from the past 240 hour(s)).   Medications:   .  stroke: mapping our early stages of recovery book   Does not apply Once  . aspirin  300 mg Rectal Daily   Or  . aspirin  325 mg Oral Daily  . carbidopa-levodopa  1 tablet Oral Daily  . cholecalciferol  400 Units Oral Daily   . heparin  5,000 Units Subcutaneous 3 times per day  . pantoprazole (PROTONIX) IV  40 mg Intravenous Q24H   Continuous Infusions:   Time spent: 25 minutes.   LOS: 1 day   Crespin Forstrom  [facility] Pager [phone]. If unable to reach me by pager, please call my cell phone at [phone].  *Please refer to [email or web address], password [email or web address] to get updated schedule on who will round on this patient, as hospitalists switch teams weekly. If 7PM-7AM, please contact night-coverage at [email or web address], password [email or web address] for any overnight needs.  03/03/2015, 2:56 PM

## 2015-03-03 NOTE — Progress Notes (Signed)
[facility] transferred [id number]. [facility] admission notify to message attending MD of pt's arrival. No family at bedside. Patient appears stable at this time. Per Rn at [facility], Q2 VS completed since yesterday.TELE applied and confirmed. Will continue to monitor.   Sim BoastHavy, RN

## 2015-03-03 NOTE — Progress Notes (Signed)
Echocardiogram 2D Echocardiogram has been performed.  Nolon RodBrown, Tony 03/03/2015, 1:55 PM

## 2015-03-03 NOTE — ED Notes (Signed)
Attempted to call report. Receiving nurse with another patient and is to return call for report.

## 2015-03-03 NOTE — Progress Notes (Addendum)
ED CM went to confirm admission RN spoke with daughter  No family in room Pt unable to provide information Cm left a 3 page sheet of medicare providers within zip code of [phone]  In pt belonging bag   Entered in d/c instructions  Please use the list of medicare providers provided to you or go to medicare.gov or call the number on the medicare card to obtain a list of available medicare doctors locally Schedule an appointment as soon as possible for a visit on 03/07/2015 As needed

## 2015-03-03 NOTE — ED Notes (Signed)
Pt's daughter [name](Peggy Norton) [phone].

## 2015-03-03 NOTE — ED Notes (Signed)
US at bedside

## 2015-03-03 NOTE — Progress Notes (Signed)
Rec'ved report from night shift. Pt is sitting up in the bed requesting a bed pan. She remains on a monitor in a SR w/o ectopy. Pt did void 100cc of yellow urine in the bedpan . Her linen was changed and a diaper was placed on the pt. Pt has tremors . Per charge pt was moved to room 5 in the main ED. Pt is able top follow simple commands and does turn when instructed to do so. Her speech remains garbled at times.

## 2015-03-03 NOTE — ED Notes (Signed)
Report given to [facility]  

## 2015-03-03 NOTE — ED Notes (Signed)
Bed: WA28 Expected date:  Expected time:  Means of arrival:  Comments: 

## 2015-03-03 NOTE — Progress Notes (Signed)
VASCULAR LAB PRELIMINARY  PRELIMINARY  PRELIMINARY  PRELIMINARY  Carotid duplex completed.    Preliminary report:  Bilateral:  1-39% ICA stenosis lower end of scale..  Vertebral artery flow is antegrade.     Allison Marshall, RVS 03/03/2015, 8:24 AM

## 2015-03-04 DIAGNOSIS — I639 Cerebral infarction, unspecified: Secondary | ICD-10-CM

## 2015-03-04 LAB — HEMOGLOBIN A1C
HEMOGLOBIN A1C: 5.6 % (ref 4.8–5.6)
MEAN PLASMA GLUCOSE: 114 mg/dL

## 2015-03-04 MED ORDER — PRAVASTATIN SODIUM 20 MG PO TABS
20.0000 mg | ORAL_TABLET | Freq: Every day | ORAL | Status: DC
  Administered 2015-03-04 – 2015-03-07 (×4): 20 mg via ORAL
  Filled 2015-03-04 (×4): qty 1

## 2015-03-04 MED ORDER — PANTOPRAZOLE SODIUM 40 MG PO TBEC
40.0000 mg | DELAYED_RELEASE_TABLET | Freq: Every day | ORAL | Status: DC
  Administered 2015-03-04 – 2015-03-08 (×5): 40 mg via ORAL
  Filled 2015-03-04 (×5): qty 1

## 2015-03-04 NOTE — Progress Notes (Signed)
STROKE TEAM PROGRESS NOTE   HISTORY Allison Marshall is an 82 y.o. female with history of Parkinson's disease as well as peptic ulcer disease with upper GI hemorrhage, brought to the ED following acute onset of speech difficulty. Patient had difficulty formulating words as well as unintelligible speech at times. She had no difficulty understanding what was being said to her. She has no previous history of stroke nor TIA. There was no focal weakness. She has not been on antiplatelet therapy. CT scan of the head showed no focal left frontotemporal hypodensity, likely acute ischemic stroke. NIH stroke score was 3. She was LKW 9 AM on 03/02/2015. Initially presented to [facility], transferred to [facility] for stroke workup. Patient was not administered TPA secondary to delay in arrival . She was admitted for further evaluation and treatment.   SUBJECTIVE (INTERVAL HISTORY) No family is at the bedside.  Overall she feels her condition is stable. She states she walks with a walker, that she does not fall. She lives alone, daughter lives next to her and checks on her frequently.   OBJECTIVE Temp:  [97.7 F (36.5 C)-98.6 F (37 C)] 98.1 F (36.7 C) (01/13 1034) Pulse Rate:  [60-101] 76 (01/13 1034) Cardiac Rhythm:  [-] Normal sinus rhythm (01/13 0700) Resp:  [13-20] 18 (01/13 1034) BP: (102-158)/(52-87) 136/64 mmHg (01/13 1034) SpO2:  [92 %-100 %] 92 % (01/13 1034)  CBC:   Recent Labs Lab 03/02/15 2135  WBC 6.9  NEUTROABS 4.6  HGB 13.4  HCT 40.0  MCV 95.5  PLT 228    Basic Metabolic Panel:   Recent Labs Lab 03/02/15 2135  NA 141  K 4.0  CL 106  CO2 26  GLUCOSE 89  BUN 14  CREATININE 0.67  CALCIUM 9.2    Lipid Panel:     Component Value Date/Time   CHOL 158 03/03/2015 0512   TRIG 57 03/03/2015 0512   HDL 51 03/03/2015 0512   CHOLHDL 3.1 03/03/2015 0512   VLDL 11 03/03/2015 0512   LDLCALC 96 03/03/2015 0512   HgbA1c:  Lab Results  Component Value Date   HGBA1C 5.6 03/03/2015    Urine Drug Screen: No results found for: LABOPIA, COCAINSCRNUR, LABBENZ, AMPHETMU, THCU, LABBARB    IMAGING  Dg Chest 2 View 03/02/2015   Cardiomegaly.  Hiatal hernia.   Ct Head Wo Contrast 03/02/2015   Focal left posterior temporal lobe hypodensity compatible with an infarct, likely acute or subacute. Clinical correlation is recommended. CT angiography or MRI is recommended for better evaluation. No acute intracranial hemorrhage.   MRI HEAD  03/04/2015   1. Acute ischemic nonhemorrhagic moderate-sized left MCA territory infarct. 2. Mild chronic microvascular ischemic disease.   MRA HEAD  03/04/2015   1. No large or proximal arterial branch occlusion. 2. Single subtle short-segment mild stenosis within the mid left M1 segment, with additional focal mild stenosis within the proximal right PCA. Otherwise normal intracranial MRA.   Carotid Doppler   There is 1-39% bilateral ICA stenosis. Vertebral artery flow is antegrade.    2D Echocardiogram  - Left ventricle: The cavity size was normal. Systolic function wasnormal. The estimated ejection fraction was in the range of 60%to 65%. Wall motion was normal; there were no regional wall motion abnormalities. Doppler parameters are consistent withabnormal left ventricular relaxation (grade 1 diastolicdysfunction). - Aortic valve: There is an abnormal partially mobile lineardensity on the aortic side of the aortic valve. It may be an offaxis view of the other leaflet, but TEE  is recommended to exclude tumor or other mass. There was trivial regurgitation. - Mitral valve: Mildly calcified annulus. There was mildregurgitation. - Left atrium: The atrium was moderately dilated. - Tricuspid valve: There was mild-moderate regurgitation. - Pulmonary arteries: Systolic pressure was moderately increased.PA peak pressure: 48 mm Hg (S).   PHYSICAL EXAM [name] sitting in a bedside chair. . Afebrile. Head is nontraumatic.  Neck is supple without bruit.    Cardiac exam no murmur or gallop. Lungs are clear to auscultation. Distal pulses are well felt. Neurological Exam :  Awake alert oriented 3. Diminished attension and restoration and recall. Speech and language appear normal. No aphasia or apraxia dysarthria. Able to name repeat and comprehend quite well. Extraocular movements are full range without nystagmus. Motor system exam reveals symmetric upper and lower extremity strength. No focal weakness. Mild intermittent resting tremor right upper molar left upper extremity and tremor also involves the jaw and lower lip. Mild cogwheel rigidity at the right wrist. Deep tendon reflexes are 1+ symmetric. Plantars downgoing. Coordination slow but accurate. Gait was not tested. ASSESSMENT/PLAN Allison Marshall is a 82 y.o. female with history of Parkinson's disease and peptic ulcer disease with upper GI hemorrhage [facility] speech output difficulty, [facility]. She did not receive IV t-PA due to delay in arrival.   Stroke:  left MCA infarct embolic secondary to unknown source  Resultant  Speech difficulty (dysarthria) resolving  MRI  L MCA infarct  MRA  No large vessel occlusion, focal stenosis L M1 and prox R PCA  Carotid Doppler  No significant stenosis   2D Echo  abnormal partially mobile lineardensity on the aortic side of the aortic valve, TEE is recommended - Dr. Pearlean BrownieSethi agrees. Etiology unclear. TEE requested. Anticipate it will be done on Monday.  LDL 96  HgbA1c 5.6  Lovenox 40 mg sq daily for VTE prophylaxis Diet Heart Room service appropriate?: Yes with Assist; Fluid consistency:: Thin  No antithrombotic prior to admission, now on aspirin 325 mg daily. Recommend change to plavix alone  No further embolic workup as she is not a good long-term anticoagulation candidate  Ongoing aggressive stroke risk factor management  Therapy recommendations:  HH  PT  Disposition:  pending   Hypertension  Stable  Hyperlipidemia  Home meds:  No statin  LDL 96, goal < 70  Added pravachol 20  Continue statin at discharge  Other Stroke Risk Factors  Advanced age  Other Active Problems  Parkinson's Disease  Hx GIB secondary to PUD  Hospital day # 2  Rhoderick MoodyBIBY,SHARON  [facility] See Amion for Pager information 03/04/2015 11:28 AM  I have personally examined this patient, reviewed notes, independently viewed imaging studies, participated in medical decision making and plan of care. I have made any additions or clarifications directly to the above note. Agree with note above. She presented with acute onset speech output difficulty is due to an embolic left MCA branch infarct etiology to be determined. She remains at risk for neurological worsening, recurrent stroke, TIA needs ongoing stroke evaluation and aggressive risk factor modification. Recommend Plavix instead of aspirin given history of upper GI hemorrhage. Check TEE to evaluate aortic valve density Delia HeadyPramod Terricka Onofrio, MD Medical Director [facility] Pager: [phone] 03/04/2015 5:09 PM    To contact Stroke Continuity provider, please refer to [email or web address]. After hours, contact General Neurology

## 2015-03-04 NOTE — Evaluation (Signed)
Speech Language Pathology Evaluation Patient Details Name: Peyton BottomsBertha L Gunnerson MRN: [id number] DOB: 02/08/1927 Today's Date: 03/04/2015 Time: 1420-1440 SLP Time Calculation (min) (ACUTE ONLY): 20 min  Problem List:  Patient Active Problem List   Diagnosis Date Noted  . Difficulty speaking 03/02/2015  . PUD (peptic ulcer disease)   . Cerebral infarction due to unspecified mechanism   . Parkinson disease [facility](HCC)    Past Medical History:  Past Medical History  Diagnosis Date  . Parkinson disease (HCC)   . GIB (gastrointestinal bleeding)   . PUD (peptic ulcer disease)    Past Surgical History: History reviewed. No pertinent past surgical history. HPI:  Ms. Peyton BottomsBertha L Wernert is a 82 y.o. female with history of Parkinson's disease and peptic ulcer disease with upper GI hemorrhage [facility], [facility]. Found to have left MCA infarct.     Assessment / Plan / Recommendation Clinical Impression  Pt demonstrates moderate aphasia with mild receptive deficits with multistep commands. Primary deficits include phonemic paraphasias interrupting fluency of speech, though with many correct, fluent phrases as well. Repetition and reading are also impacted. Pt responded well to carrier phrases and phonemic cues. Recommend f/u with home health SLP if 24 hour sueprvision is available at d/c.     SLP Assessment  Patient needs continued Speech Lanaguage Pathology Services    Follow Up Recommendations       Frequency and Duration min 2x/week  2 weeks      SLP Evaluation Prior Functioning  Cognitive/Linguistic Baseline: Within functional limits Type of Home: House  Lives With: Alone (daughter Michaelyn Barterenarby) Available Help at Discharge: Family;Available PRN/intermittently Vocation: Retired   IT consultantCognition  Overall Cognitive Status: Within Functional Limits for tasks assessed Arousal/Alertness: Awake/alert Orientation Level: Oriented  X4 Attention: Selective Selective Attention: Appears intact Memory: Appears intact Problem Solving: Appears intact Safety/Judgment: Appears intact    Comprehension  Auditory Comprehension Overall Auditory Comprehension: Impaired Yes/No Questions: Not tested Commands: Impaired One Step Basic Commands: 75-100% accurate Two Step Basic Commands: 50-74% accurate Multistep Basic Commands: 0-24% accurate Complex Commands: 0-24% accurate Visual Recognition/Discrimination Discrimination: Within Function Limits Reading Comprehension Reading Status: Impaired Word level: Impaired Sentence Level: Impaired Paragraph Level: Impaired Functional Environmental (signs, name badge): Not tested    Expression Expression Primary Mode of Expression: Verbal Verbal Expression Overall Verbal Expression: Impaired Initiation: No impairment Automatic Speech: Name;Social Response Level of Generative/Spontaneous Verbalization: Phrase Repetition: Impaired Level of Impairment: Phrase level Naming: Impairment Responsive: Not tested Confrontation: Impaired Convergent: Not tested Divergent: Not tested Pragmatics: No impairment   Oral / Motor  Oral Motor/Sensory Function Overall Oral Motor/Sensory Function: Within functional limits Motor Speech Overall Motor Speech: Appears within functional limits for tasks assessed   GO                   Harlon DittyBonnie Vittorio Mohs, MA CCC-SLP [phone]  Claudine MoutonDeBlois, Amantha Sklar Caroline 03/04/2015, 3:12 PM

## 2015-03-04 NOTE — Progress Notes (Signed)
Patient Demographics:    Allison Marshall, is a 82 y.o. female, DOB - 09-05-1926, ZOX:[id number]  Admit date - 03/02/2015   Admitting Physician Lorretta Harp, MD  Outpatient Primary MD for the patient is [name], MD  LOS - 2   Chief Complaint  Patient presents with  . Altered Mental Status        Subjective:    Allison Marshall today has, No headache, No chest pain, No abdominal pain - No Nausea, No new weakness tingling or numbness, No Cough - SOB.     Assessment  & Plan :     1. 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 Neurology on board, full stroke protocol underway, continue aspirin, statin added as LDL was above goal, will be evaluated by PT-OT and speech therapy. Most likely require SNF. Echogram and carotid duplex stable, pending A1c and TEE requested by neurology.  2. Parkinson's disease. Table continue home medication which is carbidopa-levodopa combination. PT and supportive care.  3. Hypertension. Allow for permissive hypertension due to #1 above, as needed IV hydralazine.  4. GERD and history of GI bleed. Place on PPI.    Code Status : Full  Family Communication  : none  Disposition Plan  : SNF  Consults  :  Neuro  Procedures  :   TEE  CT and MRI brain consistent with left MCA territory embolic infarct.  TTE  Left ventricle: The cavity size was normal. Systolic function was normal. The estimated ejection fraction was in the range of 60% to 65%. Wall motion was normal; there were no regional wallmotion abnormalities. Doppler parameters are consistent withabnormal left ventricular relaxation (grade 1 diastolicdysfunction). - Aortic valve: There is an abnormal partially mobile lineardensity on the aortic side of the aortic valve. It may be an offaxis  view of the other leaflet, but TEE is recommended to excludetumor or other mass. There was trivial regurgitation. - Mitral valve: Mildly calcified annulus. There was mildregurgitation. - Left atrium: The atrium was moderately dilated. - Tricuspid valve: There was mild-moderate regurgitation. - Pulmonary arteries: Systolic pressure was moderately increased. PA peak pressure: 48 mm Hg (S).  Recommendations: Recommend transesophageal echocardiography if clinically indicated in order to further evaluate the aortic valve.   Carotids - Bilateral - 1% to 39% ICA stenosis lower end of range. Vertebral artery flow is antegrade.    DVT Prophylaxis  :  Lovenox    Lab Results  Component Value Date   PLT 228 03/02/2015    Inpatient Medications  Scheduled Meds: .  stroke: mapping our early stages of recovery book   Does not apply Once  . aspirin  300 mg Rectal Daily   Or  . aspirin  325 mg Oral Daily  . carbidopa-levodopa  1 tablet Oral Daily  . cholecalciferol  400 Units Oral Daily  . enoxaparin (LOVENOX) injection  40 mg Subcutaneous Q24H  . pantoprazole (PROTONIX) IV  40 mg Intravenous Q24H  . pravastatin  20 mg Oral q1800   Continuous Infusions:  PRN Meds:.senna-docusate  Antibiotics  :     Anti-infectives    None        Objective:   Filed Vitals:   03/04/15 0440 03/04/15 0530 03/04/15 1034 03/04/15 1325  BP:  126/61 116/64 136/64 171/51  Pulse: 69 67 76 72  Temp: 98.1 F (36.7 C) 98.2 F (36.8 C) 98.1 F (36.7 C) 97.4 F (36.3 C)  TempSrc: Oral Oral Oral Oral  Resp: 18 18 18 18   SpO2: 96% 100% 92% 100%    Wt Readings from Last 3 Encounters:  No data found for Wt     Intake/Output Summary (Last 24 hours) at 03/04/15 1552 Last data filed at 03/03/15 1826  Gross per 24 hour  Intake    240 ml  Output      0 ml  Net    240 ml     Physical Exam  Awake Alert, Oriented X 3, No new F.N deficits, Normal affect, mild dysarthria but generalized resting tremors  mostly in upper extremities [place].AT,PERRAL Supple Neck,No JVD, No cervical lymphadenopathy appriciated.  Symmetrical Chest wall movement, Good air movement bilaterally, CTAB RRR,No Gallops,Rubs or new Murmurs, No Parasternal Heave +ve B.Sounds, Abd Soft, No tenderness, No organomegaly appriciated, No rebound - guarding or rigidity. No Cyanosis, Clubbing or edema, No new Rash or bruise       Data Review:   Micro Results No results found for this or any previous visit (from the past 240 hour(s)).  Radiology Reports Dg Chest 2 View  03/02/2015  CLINICAL DATA:  Altered mental status EXAM: CHEST  2 VIEW COMPARISON:  None. FINDINGS: The heart is enlarged. Hiatal hernia is present. There are no focal consolidations or pleural effusions. No pulmonary edema. IMPRESSION: Cardiomegaly.  Hiatal hernia. Electronically Signed   By: Norva Pavlov M.D.   On: 03/02/2015 21:43   Ct Head Wo Contrast  03/02/2015  CLINICAL DATA:  82 year old female with Parkinson's presenting with change in mental status and difficulty with speech EXAM: CT HEAD WITHOUT CONTRAST TECHNIQUE: Contiguous axial images were obtained from the base of the skull through the vertex without intravenous contrast. COMPARISON:  None FINDINGS: The ventricles and sulci are appropriate in size for patient's age. Mild periventricular and deep white matter hypodensities represent chronic microvascular ischemic changes. There is a focal area of hypodensity in the posterior left temporal lobe compatible with an acute or subacute infarct. Clinical correlation is recommended. There is no intracranial hemorrhage. No mass effect or midline shift identified. The visualized paranasal sinuses and mastoid air cells are well aerated. The calvarium is intact. IMPRESSION: Focal left posterior temporal lobe hypodensity compatible with an infarct, likely acute or subacute. Clinical correlation is recommended. CT angiography or MRI is recommended for better evaluation.  No acute intracranial hemorrhage. Critical Value/emergent results were called by telephone at the time of interpretation on 03/02/2015 at 10:40 pm to Dr. Tyrone Apple , who verbally acknowledged these results. Electronically Signed   By: Elgie Collard M.D.   On: 03/02/2015 22:40   Mr Brain Wo Contrast  03/04/2015  CLINICAL DATA:  Initial evaluation for acute speech changes. EXAM: MRI HEAD WITHOUT CONTRAST MRA HEAD WITHOUT CONTRAST TECHNIQUE: Multiplanar, multiecho pulse sequences of the brain and surrounding structures were obtained without intravenous contrast. Angiographic images of the head were obtained using MRA technique without contrast. COMPARISON:  Prior CT from 03/01/2014 FINDINGS: MRI HEAD FINDINGS Diffuse prominence of the CSF containing spaces compatible with generalized cerebral atrophy, within normal limits for patient age. Patchy T2/FLAIR hyperintensity within the periventricular and deep white matter both cerebral hemispheres most consistent with chronic small vessel ischemic disease, mild in nature. Small vessel type changes present within the pons as well. Small remote lacunar infarcts within the  bilateral thalami. There is moderate-sized confluent restricted diffusion within the posterior left temporal lobe, consistent with acute left MCA territory infarct. Patchy cortical infarct within the posterior insula. Additional scattered cortical infarcts more superiorly within the left frontoparietal region. Involvement of the sensory strip. Associated gyral swelling without significant edema. No associated hemorrhage. Major intracranial vascular flow voids maintained. No other infarct. No mass lesion, midline shift, or mass effect. No hydrocephalus. No extra-axial fluid collection. Craniocervical junction within normal limits. Pituitary gland normal. No acute abnormality about the orbits. Sequela prior bilateral lens extraction noted. Mild to moderate mucosal thickening within the left sphenoid  sinus. Paranasal sinuses are otherwise clear. Trace opacity within the mastoid air cells bilaterally. Inner ear structures grossly normal. Bone marrow signal intensity within normal limits. No scalp soft tissue abnormality. MRA HEAD FINDINGS ANTERIOR CIRCULATION: Visualized distal cervical segments of the internal carotid arteries are patent with antegrade flow. Petrous, cavernous, and supraclinoid segments widely patent. A1 segments well opacified. Anterior communicating artery normal. Anterior cerebral arteries patent to their distal aspects. M1 segments patent without occlusion. Short-segment mild stenosis within the mid left M1 segment (series 304, image 13). No other focal stenosis. MCA bifurcations normal. Distal MCA branches well opacified. No proximal M2 branch occlusion. POSTERIOR CIRCULATION: Vertebral arteries patent to the vertebrobasilar junction. Right vertebral artery is dominant. Posterior inferior cerebral arteries patent. Dominant left anterior inferior cerebral artery noted. Basilar artery well opacified. Superior cerebellar arteries patent bilaterally. Both posterior cerebral arteries arise from the basilar artery and are well opacified to their distal aspects. Single short segment mild stenosis within the proximal right PCA (series 306, image 14). No aneurysm. IMPRESSION: MRI HEAD IMPRESSION: 1. Acute ischemic nonhemorrhagic moderate-sized left MCA territory infarct. 2. Mild chronic microvascular ischemic disease. MRA HEAD IMPRESSION: 1. No large or proximal arterial branch occlusion. 2. Single subtle short-segment mild stenosis within the mid left M1 segment, with additional focal mild stenosis within the proximal right PCA. Otherwise normal intracranial MRA. Electronically Signed   By: Rise Mu M.D.   On: 03/04/2015 00:06   Mr Maxine Glenn Head/brain Wo Cm  03/04/2015  CLINICAL DATA:  Initial evaluation for acute speech changes. EXAM: MRI HEAD WITHOUT CONTRAST MRA HEAD WITHOUT CONTRAST  TECHNIQUE: Multiplanar, multiecho pulse sequences of the brain and surrounding structures were obtained without intravenous contrast. Angiographic images of the head were obtained using MRA technique without contrast. COMPARISON:  Prior CT from 03/01/2014 FINDINGS: MRI HEAD FINDINGS Diffuse prominence of the CSF containing spaces compatible with generalized cerebral atrophy, within normal limits for patient age. Patchy T2/FLAIR hyperintensity within the periventricular and deep white matter both cerebral hemispheres most consistent with chronic small vessel ischemic disease, mild in nature. Small vessel type changes present within the pons as well. Small remote lacunar infarcts within the bilateral thalami. There is moderate-sized confluent restricted diffusion within the posterior left temporal lobe, consistent with acute left MCA territory infarct. Patchy cortical infarct within the posterior insula. Additional scattered cortical infarcts more superiorly within the left frontoparietal region. Involvement of the sensory strip. Associated gyral swelling without significant edema. No associated hemorrhage. Major intracranial vascular flow voids maintained. No other infarct. No mass lesion, midline shift, or mass effect. No hydrocephalus. No extra-axial fluid collection. Craniocervical junction within normal limits. Pituitary gland normal. No acute abnormality about the orbits. Sequela prior bilateral lens extraction noted. Mild to moderate mucosal thickening within the left sphenoid sinus. Paranasal sinuses are otherwise clear. Trace opacity within the mastoid air cells bilaterally. Inner ear structures grossly  normal. Bone marrow signal intensity within normal limits. No scalp soft tissue abnormality. MRA HEAD FINDINGS ANTERIOR CIRCULATION: Visualized distal cervical segments of the internal carotid arteries are patent with antegrade flow. Petrous, cavernous, and supraclinoid segments widely patent. A1 segments well  opacified. Anterior communicating artery normal. Anterior cerebral arteries patent to their distal aspects. M1 segments patent without occlusion. Short-segment mild stenosis within the mid left M1 segment (series 304, image 13). No other focal stenosis. MCA bifurcations normal. Distal MCA branches well opacified. No proximal M2 branch occlusion. POSTERIOR CIRCULATION: Vertebral arteries patent to the vertebrobasilar junction. Right vertebral artery is dominant. Posterior inferior cerebral arteries patent. Dominant left anterior inferior cerebral artery noted. Basilar artery well opacified. Superior cerebellar arteries patent bilaterally. Both posterior cerebral arteries arise from the basilar artery and are well opacified to their distal aspects. Single short segment mild stenosis within the proximal right PCA (series 306, image 14). No aneurysm. IMPRESSION: MRI HEAD IMPRESSION: 1. Acute ischemic nonhemorrhagic moderate-sized left MCA territory infarct. 2. Mild chronic microvascular ischemic disease. MRA HEAD IMPRESSION: 1. No large or proximal arterial branch occlusion. 2. Single subtle short-segment mild stenosis within the mid left M1 segment, with additional focal mild stenosis within the proximal right PCA. Otherwise normal intracranial MRA. Electronically Signed   By: Rise MuBenjamin  McClintock M.D.   On: 03/04/2015 00:06     CBC  Recent Labs Lab 03/02/15 2135  WBC 6.9  HGB 13.4  HCT 40.0  PLT 228  MCV 95.5  MCH 32.0  MCHC 33.5  RDW 12.2  LYMPHSABS 1.8  MONOABS 0.5  EOSABS 0.1  BASOSABS 0.1    Chemistries   Recent Labs Lab 03/02/15 2135  NA 141  K 4.0  CL 106  CO2 26  GLUCOSE 89  BUN 14  CREATININE 0.67  CALCIUM 9.2  AST 18  ALT 9*  ALKPHOS 69  BILITOT 1.2   ------------------------------------------------------------------------------------------------------------------  Recent Labs  03/03/15 0512  CHOL 158  HDL 51  LDLCALC 96  TRIG 57  CHOLHDL 3.1    Lab Results    Component Value Date   HGBA1C 5.6 03/03/2015   ------------------------------------------------------------------------------------------------------------------ No results for input(s): TSH, T4TOTAL, T3FREE, THYROIDAB in the last 72 hours.  Invalid input(s): FREET3 ------------------------------------------------------------------------------------------------------------------ No results for input(s): VITAMINB12, FOLATE, FERRITIN, TIBC, IRON, RETICCTPCT in the last 72 hours.  Coagulation profile  Recent Labs Lab 03/02/15 2134  INR 1.17    No results for input(s): DDIMER in the last 72 hours.  Cardiac Enzymes No results for input(s): CKMB, TROPONINI, MYOGLOBIN in the last 168 hours.  Invalid input(s): CK ------------------------------------------------------------------------------------------------------------------ No results found for: BNP  Time Spent in minutes  35   Theodis Kinsel K M.D on 03/04/2015 at 3:52 PM  Between 7am to 7pm - Pager - [phone]  After 7pm go to [email or web address] - password [email or web address]  [facility] -  Office  [phone]

## 2015-03-04 NOTE — Progress Notes (Signed)
Occupational Therapy Evaluation Patient Details Name: Allison BottomsBertha L Farace MRN: [id number] DOB: 04/20/1926 Today's Date: 03/04/2015    History of Present Illness Pt is an 82 y/o female who presents with difficulty speaking. MRI revealed moderate sized L MCA stroke.    Clinical Impression   PTA, pt was independent with all ADLs and mobility. Pt currently requires min-min guard assist for all ADLs and functional mobility. Pt required verbal cues to sequence some ADL tasks including pericare and brushing teeth - some motor apraxia and perseveration noted during session as well, but difficult to fully assess due to pt's expressive aphasia. Recommending SNF for post-acute rehab to increase independence and safety with ADLs and mobility especially since pt lives alone and has intermittent assistance from family. Will continue to follow acutely.    Follow Up Recommendations  SNF    Equipment Recommendations  Other (comment) (TBD in next venue unless d/c plans change)    Recommendations for Other Services       Precautions / Restrictions Precautions Precautions: Fall Restrictions Weight Bearing Restrictions: No      Mobility Bed Mobility Overal bed mobility: Needs Assistance Bed Mobility: Sit to Supine       Sit to supine: Min guard   General bed mobility comments: HOB flat, no use of bedrails. Min guard for safety, no physical assist provided  Transfers Overall transfer level: Needs assistance Equipment used: Rolling walker (2 wheeled) Transfers: Sit to/from Stand Sit to Stand: Min assist         General transfer comment: Min assist for boost to stand and for LOB on initial sit-stand. Verbal cues for safe hand placement on RW and to keep RW close to body while ambulating. Pt attempts to abandon RW at times and requires cues for safe positioning of RW during activities.Pt with relatively good static standing balance overall.    Balance Overall balance assessment: Needs  assistance Sitting-balance support: No upper extremity supported;Feet supported Sitting balance-Leahy Scale: Good     Standing balance support: No upper extremity supported;During functional activity Standing balance-Leahy Scale: Poor Standing balance comment: LOB x1 upon standing. Min guard assist for ADLs at sink.                            ADL Overall ADL's : Needs assistance/impaired     Grooming: Wash/dry hands;Oral care;Min guard;Standing;Cueing for sequencing Grooming Details (indicate cue type and reason): Cues to sequence through brushing teeth - some perseveration noted             Lower Body Dressing: Set up;Sit to/from stand Lower Body Dressing Details (indicate cue type and reason): Able to cross ankle-over-knee to reach socks Toilet Transfer: Min guard;Cueing for safety;Ambulation;BSC;RW Toilet Transfer Details (indicate cue type and reason): BSC over toilet. Cues to bring RW as she backed up to toilet Toileting- Clothing Manipulation and Hygiene: Min guard;Cueing for sequencing;Sitting/lateral lean Toileting - Clothing Manipulation Details (indicate cue type and reason): Cues to for sequencing to complete pericare     Functional mobility during ADLs: Min guard;Cueing for safety;Cueing for sequencing;Rolling walker General ADL Comments: Pt with some perseveration and ideomotor apraxia noted during session, but difficult to assess due to expressive aphasia and slight confusion. Pt able to complete all ADLs with some verbal cues for sequencing and min guard assist for balance. No family present for OT eval.     Vision Vision Assessment?: Vision impaired- to be further tested in functional context Additional Comments:  Check this further - difficulty with following commands.    Perception     Praxis Praxis Praxis tested?: Deficits Deficits: Ideomotor;Perseveration;Organization Praxis-Other Comments: Difficult to assess if this was true apraxia or  confusion. Will need to assess further.    Pertinent Vitals/Pain Pain Assessment: No/denies pain     Hand Dominance Right   Extremity/Trunk Assessment Upper Extremity Assessment Upper Extremity Assessment: Generalized weakness (Bilateral hand tremors at rest - typical at baseline)   Lower Extremity Assessment Lower Extremity Assessment: Defer to PT evaluation   Cervical / Trunk Assessment Cervical / Trunk Assessment: Kyphotic   Communication Communication Communication: Expressive difficulties   Cognition Arousal/Alertness: Awake/alert Behavior During Therapy: Anxious (Tearful about not being able to speak well) Overall Cognitive Status: No family/caregiver present to determine baseline cognitive functioning Area of Impairment: Attention;Memory;Safety/judgement;Following commands;Awareness;Problem solving   Current Attention Level: Selective Memory: Decreased short-term memory Following Commands: Follows one step commands with increased time Safety/Judgement: Decreased awareness of safety Awareness: Emergent Problem Solving: Slow processing;Difficulty sequencing;Requires verbal cues General Comments: Pt has difficulty with word finding and could not name the hospital but could provide the city and year. Pt had difficulty sequencing some ADL tasks including pericare and brushing teeth. Possible motor apraxia noted during strength testing - pt very confused and could not mimic motions that therapist demonstrated.   General Comments       Exercises       Shoulder Instructions      Home Living Family/patient expects to be discharged to:: Private residence Living Arrangements: Alone Available Help at Discharge: Family;Available PRN/intermittently Type of Home: House Home Access: Level entry     Home Layout: Two level;Able to live on main level with bedroom/bathroom Alternate Level Stairs-Number of Steps: Flight   Bathroom Shower/Tub: Contractor: Standard     Home Equipment: Environmental consultant - 2 wheels;Bedside commode;Grab bars - tub/shower;Cane - single point   Additional Comments: When asked about shower pt had difficult time expressing what kind of shower she had. She stated "I sit down on it here (pointing) and turn the thing on and wash up." Unsure if pt has tub/shower or walk-in and if there is any DME in the bathroom.   Lives With: Alone (2 daughters - 1 lives very close by)    Prior Functioning/Environment Level of Independence: Independent with assistive device(s)        Comments: Pt reports she was independent with all ADLs and some IADLs (cooking, minor cleaning). Unsure of accuracy of this information.    OT Diagnosis: Generalized weakness;Cognitive deficits   OT Problem List: Decreased strength;Decreased activity tolerance;Impaired balance (sitting and/or standing);Decreased coordination;Decreased cognition;Decreased safety awareness;Decreased knowledge of use of DME or AE   OT Treatment/Interventions: Self-care/ADL training;Therapeutic exercise;Energy conservation;DME and/or AE instruction;Therapeutic activities;Cognitive remediation/compensation;Patient/family education;Balance training    OT Goals(Current goals can be found in the care plan section) Acute Rehab OT Goals Patient Stated Goal: to be independent  OT Goal Formulation: With patient Time For Goal Achievement: 03/18/15 Potential to Achieve Goals: Good ADL Goals Pt Will Perform Grooming: with modified independence;standing Pt Will Perform Upper Body Dressing: with modified independence;sitting Pt Will Perform Lower Body Dressing: with modified independence;sit to/from stand Pt Will Transfer to Toilet: with modified independence;ambulating;regular height toilet Pt Will Perform Toileting - Clothing Manipulation and hygiene: with modified independence;sitting/lateral leans Pt Will Perform Tub/Shower Transfer: Tub transfer;with supervision;ambulating;grab  bars;rolling walker  OT Frequency: Min 2X/week   Barriers to D/C: Decreased caregiver support  Lives alone -  daughters only available intermittently       Co-evaluation              End of Session Equipment Utilized During Treatment: Gait belt;Rolling walker Nurse Communication: Mobility status;Other (comment) (Pt requesting water)  Activity Tolerance: Patient tolerated treatment well Patient left: in bed;with call bell/phone within reach;with bed alarm set   Time: 0981-1914 OT Time Calculation (min): 40 min Charges:  OT General Charges $OT Visit: 1 Procedure OT Evaluation $OT Eval Moderate Complexity: 1 Procedure OT Treatments $Self Care/Home Management : 23-37 mins G-Codes:    Nils Pyle, OTR/L Pager: [phone] 03/04/2015, 4:34 PM

## 2015-03-04 NOTE — Clinical Social Work Note (Signed)
CSW received referral for SNF.  Case discussed with case manager and plan is to discharge home with home health.  CSW to sign off please re-consult if social work needs arise.  Tammy Ericsson R. Freddie Dymek, MSW, LCSWA [phone]  

## 2015-03-04 NOTE — Care Management Important Message (Signed)
Important Message  Patient Details  Name: Allison BottomsBertha L Marshall MRN: [id number] Date of Birth: 06/28/1926   Medicare Important Message Given:  Yes    Allison Marshall Allison Marshall Allison Marshall 03/04/2015, 3:17 PM

## 2015-03-04 NOTE — Evaluation (Addendum)
Physical Therapy Evaluation Patient Details Name: Allison BottomsBertha L Marshall MRN: [id number] DOB: 06/12/1926 Today's Date: 03/04/2015   History of Present Illness  Pt is an 82 y/o female who presents with difficulty speaking. MRI revealed moderate sized L MCA stroke.   Clinical Impression  Pt admitted with above diagnosis. Pt currently with functional limitations due to the deficits listed below (see PT Problem List). At the time of PT eval pt was able to perform transfers and ambulation with min to mod assist. Feel pt will progress well with therapy however recommending 24 hour assist at home. If support system is not able to provide 24 hour assist, SNF may be more appropriate until pt is safe to be alone at times. Pt will benefit from skilled PT to increase their independence and safety with mobility to allow discharge to the venue listed below.       Follow Up Recommendations Home health PT;Supervision/Assistance - 24 hour    Equipment Recommendations  None recommended by PT    Recommendations for Other Services       Precautions / Restrictions Precautions Precautions: Fall Restrictions Weight Bearing Restrictions: No      Mobility  Bed Mobility Overal bed mobility: Needs Assistance Bed Mobility: Supine to Sit     Supine to sit: Mod assist     General bed mobility comments: Assist to transition to EOB. Pt moved into long sitting without assistance. Bed pad used to help scoot her legs around for sitting EOB.  Transfers Overall transfer level: Needs assistance Equipment used: 1 person hand held assist Transfers: Sit to/from Stand Sit to Stand: Min assist         General transfer comment: Steadying assist to power-up to full standing position. Once standing, pt maintained balance well statically.   Ambulation/Gait Ambulation/Gait assistance: Min assist Ambulation Distance (Feet): 200 Feet Assistive device: 1 person hand held assist Gait Pattern/deviations: Step-through  pattern;Decreased stride length;Trunk flexed;Narrow base of support Gait velocity: Decreased Gait velocity interpretation: Below normal speed for age/gender General Gait Details: Pt fairly steady with HHA. Chair follow utilized but not needed. Assist required to guide pt and provide occasional steadying assist.   Stairs            Wheelchair Mobility    Modified Rankin (Stroke Patients Only)       Balance Overall balance assessment: Needs assistance Sitting-balance support: Feet supported;No upper extremity supported Sitting balance-Leahy Scale: Good     Standing balance support: No upper extremity supported;During functional activity                                 Pertinent Vitals/Pain Pain Assessment: No/denies pain    Home Living Family/patient expects to be discharged to:: Private residence Living Arrangements: Alone Available Help at Discharge: Family;Available PRN/intermittently Type of Home: House Home Access: Level entry     Home Layout: Two level;Able to live on main level with bedroom/bathroom Home Equipment: Gilmer MorCane - single point Additional Comments: When asked if pt uses a walker at home she says yes - however from chart review it appears she has been using a cane. Not sure if pt has a walker at home. Pt overall a poor historian and unclear whether gathered information is accurate.     Prior Function Level of Independence: Independent with assistive device(s)         Comments: It appears that pt was performing ADL's without assist, but daughter was  assisting with medications, MD appts, and occasionally bringing in meals.     Hand Dominance        Extremity/Trunk Assessment   Upper Extremity Assessment: Defer to OT evaluation           Lower Extremity Assessment: Overall WFL for tasks assessed (Age appropriate strength equal R and L)      Cervical / Trunk Assessment: Kyphotic  Communication   Communication: Expressive  difficulties  Cognition Arousal/Alertness: Awake/alert Behavior During Therapy: Anxious Overall Cognitive Status: Impaired/Different from baseline Area of Impairment: Memory;Orientation;Safety/judgement;Awareness;Problem solving Orientation Level: Disoriented to;Person;Place;Time;Situation   Memory: Decreased short-term memory   Safety/Judgement: Decreased awareness of deficits Awareness: Intellectual Problem Solving: Slow processing;Difficulty sequencing;Requires verbal cues General Comments: Pt is not able to provide last name (possibly stated maiden name?), and took increased time (minutes) to recall her daughters names.     General Comments      Exercises        Assessment/Plan    PT Assessment Patient needs continued PT services  PT Diagnosis Difficulty walking   PT Problem List Decreased strength;Decreased range of motion;Decreased balance;Decreased activity tolerance;Decreased mobility;Decreased knowledge of use of DME;Decreased safety awareness;Decreased knowledge of precautions;Decreased cognition  PT Treatment Interventions DME instruction;Gait training;Stair training;Functional mobility training;Therapeutic activities;Therapeutic exercise;Neuromuscular re-education;Patient/family education   PT Goals (Current goals can be found in the Care Plan section) Acute Rehab PT Goals PT Goal Formulation: Patient unable to participate in goal setting Time For Goal Achievement: 03/18/15 Potential to Achieve Goals: Fair    Frequency Min 4X/week   Barriers to discharge Decreased caregiver support Unsure if pt has 24 hour assist available at home    Co-evaluation               End of Session Equipment Utilized During Treatment: Gait belt Activity Tolerance: Patient tolerated treatment well Patient left: in chair;with call bell/phone within reach;with chair alarm set Nurse Communication: Mobility status         Time: 7829-5621 PT Time Calculation (min) (ACUTE  ONLY): 23 min   Charges:   PT Evaluation $PT Eval Moderate Complexity: 1 Procedure PT Treatments $Gait Training: 8-22 mins   PT G Codes:        Conni Slipper 03-20-2015, 11:13 AM   Conni Slipper, PT, DPT Acute Rehabilitation Services Pager: [phone]

## 2015-03-05 NOTE — Progress Notes (Signed)
STROKE TEAM PROGRESS NOTE   HISTORY DOCUMENTED AT TIME OF INITIAL NEUROLOGY ASSESSMENT Allison Marshall is an 82 y.o. female with history of Parkinson's disease as well as peptic ulcer disease with upper GI hemorrhage, brought to the ED following acute onset of speech difficulty. Patient had difficulty formulating words as well as unintelligible speech at times. She had no difficulty understanding what was being said to her. She has no previous history of stroke nor TIA. There was no focal weakness. She has not been on antiplatelet therapy. CT scan of the head showed no focal left frontotemporal hypodensity, likely acute ischemic stroke. NIH stroke score was 3. She was LKW 9 AM on 03/02/2015. Initially presented to [facility], transferred to [facility] for stroke workup. Patient was not administered TPA secondary to delay in arrival . She was admitted for further evaluation and treatment.   SUBJECTIVE (INTERVAL HISTORY)  No family is at the bedside.  Overall she feels her condition is stable. Her RN was at the bedside.  We assisted her to the bathroom.  There were a few moments when patient exhibited retropulsion   OBJECTIVE Temp:  [97.4 F (36.3 C)-98.9 F (37.2 C)] 98.5 F (36.9 C) (01/14 0954) Pulse Rate:  [69-80] 79 (01/14 0954) Cardiac Rhythm:  [-] Normal sinus rhythm;Heart block (01/14 0759) Resp:  [16-18] 16 (01/14 0954) BP: (108-171)/(51-82) 114/63 mmHg (01/14 0954) SpO2:  [98 %-100 %] 98 % (01/14 0954)  CBC:   Recent Labs Lab 03/02/15 2135  WBC 6.9  NEUTROABS 4.6  HGB 13.4  HCT 40.0  MCV 95.5  PLT 228    Basic Metabolic Panel:   Recent Labs Lab 03/02/15 2135  NA 141  K 4.0  CL 106  CO2 26  GLUCOSE 89  BUN 14  CREATININE 0.67  CALCIUM 9.2    Lipid Panel:     Component Value Date/Time   CHOL 158 03/03/2015 0512   TRIG 57 03/03/2015 0512   HDL 51 03/03/2015 0512   CHOLHDL 3.1 03/03/2015 0512   VLDL 11 03/03/2015 0512   LDLCALC 96 03/03/2015 0512   HgbA1c:  Lab  Results  Component Value Date   HGBA1C 5.6 03/03/2015   Urine Drug Screen: No results found for: LABOPIA, COCAINSCRNUR, LABBENZ, AMPHETMU, THCU, LABBARB   IMAGING  Dg Chest 2 View 03/02/2015    Cardiomegaly.  Hiatal hernia.    Ct Head Wo Contrast 03/02/2015    Focal left posterior temporal lobe hypodensity compatible with an infarct, likely acute or subacute. Clinical correlation is recommended. CT angiography or MRI is recommended for better evaluation. No acute intracranial hemorrhage.    MRI HEAD  03/04/2015    1. Acute ischemic nonhemorrhagic moderate-sized left MCA territory infarct.  2. Mild chronic microvascular ischemic disease.   MRA HEAD  03/04/2015    1. No large or proximal arterial branch occlusion.  2. Single subtle short-segment mild stenosis within the mid left M1 segment, with additional focal mild stenosis within the proximal right PCA. Otherwise normal intracranial MRA.    Carotid Doppler   There is 1-39% bilateral ICA stenosis. Vertebral artery flow is antegrade.     2D Echocardiogram  - Left ventricle: The cavity size was normal. Systolic function wasnormal. The estimated ejection fraction was in the range of 60%to 65%. Wall motion was normal; there were no regional wall motion abnormalities. Doppler parameters are consistent withabnormal left ventricular relaxation (grade 1 diastolicdysfunction). - Aortic valve: There is an abnormal partially mobile lineardensity on the aortic side  of the aortic valve. It may be an offaxis view of the other leaflet, but TEE is recommended to exclude tumor or other mass. There was trivial regurgitation. - Mitral valve: Mildly calcified annulus. There was mildregurgitation. - Left atrium: The atrium was moderately dilated. - Tricuspid valve: There was mild-moderate regurgitation. - Pulmonary arteries: Systolic pressure was moderately increased.PA peak pressure: 48 mm Hg (S).    PHYSICAL EXAM Pleasant elderly  Caucasian lady sitting in a bedside chair. . Afebrile. Head is nontraumatic. Neck is supple without bruit.    Cardiac exam no murmur or gallop. Lungs are clear to auscultation. Distal pulses are well felt. Neurological Exam :  Awake alert oriented 3. Diminished attension and restoration and recall. Speech and language appear normal. No aphasia or apraxia dysarthria. Able to name repeat and comprehend quite well. Extraocular movements are full range without nystagmus. Motor system exam reveals symmetric upper and lower extremity strength. No focal weakness. Mild intermittent resting tremor right upper molar left upper extremity and tremor also involves the jaw and lower lip. Mild cogwheel rigidity at the right wrist. Deep tendon reflexes are 1+ symmetric. Plantars downgoing. Coordination slow but accurate. Gait was not tested.  ASSESSMENT/PLAN Ms. Allison Marshall is a 82 y.o. female with history of Parkinson's disease and peptic ulcer disease with upper GI hemorrhage [facility]. She did not receive IV t-PA due to delay in arrival.   Stroke:  left MCA infarct embolic secondary to unknown source  Resultant  Speech difficulty (dysarthria) resolving  MRI  L MCA infarct  MRA  No large vessel occlusion, focal stenosis L M1 and prox R PCA  Carotid Doppler  No significant stenosis   2D Echo  abnormal partially mobile lineardensity on the aortic side of the aortic valve, TEE is recommended -   TEE requested. Anticipate it will be done on Monday.  LDL 96  HgbA1c 5.6  Lovenox 40 mg sq daily for VTE prophylaxis Diet Heart Room service appropriate?: Yes with Assist; Fluid consistency:: Thin  No antithrombotic prior to admission, now on aspirin 325 mg daily. Recommend change to plavix alone  No further embolic workup as she is not a good long-term anticoagulation candidate  Ongoing aggressive stroke risk factor  management  Therapy recommendations:  HH PT  Disposition:  pending   Hypertension  Stable  Hyperlipidemia  Home meds:  No statin  LDL 96, goal < 70  Added pravachol 20  Continue statin at discharge   Other Stroke Risk Factors  Advanced age  Other Active Problems  Parkinson's Disease  Hx GIB secondary to PUD  Hospital day # 3  DAVID L Burnadette PeterINEHULS  [facility] See Amion for Pager information 03/05/2015 1:02 PM   I have personally examined this patient, reviewed notes, independently viewed imaging studies, participated in medical decision making and plan of care. I have made any additions or clarifications directly to the above note. Agree with note above. She presented with acute onset speech output difficulty is due to an embolic left MCA branch infarct etiology to be determined. She remains at risk for neurological worsening, recurrent stroke, TIA needs ongoing stroke evaluation and aggressive risk factor modification. Recommend Plavix instead of aspirin given history of upper GI hemorrhage.   Patient awaiting TEE  Sula Sodahere Britaney Espaillat, MD [facility] Pager: [phone] 03/05/2015 1:02 PM    To contact Stroke Continuity provider, please refer to [email or web address]. After hours,  contact General Neurology

## 2015-03-05 NOTE — Progress Notes (Signed)
Patient Demographics:    Allison Marshall, is a 82 y.o. female, DOB - [birth year], WUJ:[id number]  Admit date - 03/02/2015   Admitting Physician Lorretta Harp, MD  Outpatient Primary MD for the patient is [name], MD  LOS - 3   Chief Complaint  Patient presents with  . Altered Mental Status        Subjective:    [name] today has, No headache, No chest pain, No abdominal pain - No Nausea, No new weakness tingling or numbness, No Cough - SOB.     Assessment  & Plan :     1. 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 Neurology on board, full stroke protocol underway, continue aspirin, statin added as LDL was above goal, will be evaluated by PT-OT and speech therapy. Most likely require SNF. Echogram and carotid duplex stable, A1c 5.6, pending TEE requested by neurology.  2. Parkinson's disease. Table continue home medication which is carbidopa-levodopa combination. PT and supportive care.  3. Hypertension. Allow for permissive hypertension due to #1 above, as needed IV hydralazine.  4. GERD and history of GI bleed. Place on PPI.    Code Status : Full  Family Communication  : none  Disposition Plan  : SNF  Consults  :  Neuro  Procedures  :   TEE  CT and MRI brain consistent with left MCA territory embolic infarct.  TTE  Left ventricle: The cavity size was normal. Systolic function was normal. The estimated ejection fraction was in the range of 60% to 65%. Wall motion was normal; there were no regional wallmotion abnormalities. Doppler parameters are consistent withabnormal left ventricular relaxation (grade 1 diastolicdysfunction). - Aortic valve: There is an abnormal partially mobile lineardensity on the aortic side of the aortic valve. It may be an offaxis  view of the other leaflet, but TEE is recommended to excludetumor or other mass. There was trivial regurgitation. - Mitral valve: Mildly calcified annulus. There was mildregurgitation. - Left atrium: The atrium was moderately dilated. - Tricuspid valve: There was mild-moderate regurgitation. - Pulmonary arteries: Systolic pressure was moderately increased. PA peak pressure: 48 mm Hg (S).  Recommendations: Recommend transesophageal echocardiography if clinically indicated in order to further evaluate the aortic valve.   Carotids - Bilateral - 1% to 39% ICA stenosis lower end of range. Vertebral artery flow is antegrade.    DVT Prophylaxis  :  Lovenox    Lab Results  Component Value Date   PLT 228 03/02/2015    Inpatient Medications  Scheduled Meds: .  stroke: mapping our early stages of recovery book   Does not apply Once  . aspirin  325 mg Oral Daily  . carbidopa-levodopa  1 tablet Oral Daily  . cholecalciferol  400 Units Oral Daily  . enoxaparin (LOVENOX) injection  40 mg Subcutaneous Q24H  . pantoprazole  40 mg Oral Daily  . pravastatin  20 mg Oral q1800   Continuous Infusions:  PRN Meds:.senna-docusate  Antibiotics  :     Anti-infectives    None        Objective:   Filed Vitals:   03/04/15 2212 03/05/15 0123 03/05/15 0455 03/05/15 0954  BP: 115/56 108/53 152/82 114/63  Pulse: 80 69 76 79  Temp: 98.9  F (37.2 C) 98.1 F (36.7 C) 97.6 F (36.4 C) 98.5 F (36.9 C)  TempSrc: Oral Oral Oral Oral  Resp: 18 18 16 16   SpO2: 99% 98% 100% 98%    Wt Readings from Last 3 Encounters:  No data found for Wt     Intake/Output Summary (Last 24 hours) at 03/05/15 1038 Last data filed at 03/04/15 2030  Gross per 24 hour  Intake    120 ml  Output      0 ml  Net    120 ml     Physical Exam  Awake Alert, Oriented X 3, No new F.N deficits, Normal affect, mild dysarthria but generalized resting tremors mostly in upper extremities [place].AT,PERRAL Supple Neck,No  JVD, No cervical lymphadenopathy appriciated.  Symmetrical Chest wall movement, Good air movement bilaterally, CTAB RRR,No Gallops,Rubs or new Murmurs, No Parasternal Heave +ve B.Sounds, Abd Soft, No tenderness, No organomegaly appriciated, No rebound - guarding or rigidity. No Cyanosis, Clubbing or edema, No new Rash or bruise       Data Review:   Micro Results No results found for this or any previous visit (from the past 240 hour(s)).  Radiology Reports Dg Chest 2 View  03/02/2015  CLINICAL DATA:  Altered mental status EXAM: CHEST  2 VIEW COMPARISON:  None. FINDINGS: The heart is enlarged. Hiatal hernia is present. There are no focal consolidations or pleural effusions. No pulmonary edema. IMPRESSION: Cardiomegaly.  Hiatal hernia. Electronically Signed   By: Norva Pavlov M.D.   On: 03/02/2015 21:43   Ct Head Wo Contrast  03/02/2015  CLINICAL DATA:  82 year old female with Parkinson's presenting with change in mental status and difficulty with speech EXAM: CT HEAD WITHOUT CONTRAST TECHNIQUE: Contiguous axial images were obtained from the base of the skull through the vertex without intravenous contrast. COMPARISON:  None FINDINGS: The ventricles and sulci are appropriate in size for patient's age. Mild periventricular and deep white matter hypodensities represent chronic microvascular ischemic changes. There is a focal area of hypodensity in the posterior left temporal lobe compatible with an acute or subacute infarct. Clinical correlation is recommended. There is no intracranial hemorrhage. No mass effect or midline shift identified. The visualized paranasal sinuses and mastoid air cells are well aerated. The calvarium is intact. IMPRESSION: Focal left posterior temporal lobe hypodensity compatible with an infarct, likely acute or subacute. Clinical correlation is recommended. CT angiography or MRI is recommended for better evaluation. No acute intracranial hemorrhage. Critical Value/emergent  results were called by telephone at the time of interpretation on 03/02/2015 at 10:40 pm to Dr. Tyrone Apple , who verbally acknowledged these results. Electronically Signed   By: Elgie Collard M.D.   On: 03/02/2015 22:40   Mr Brain Wo Contrast  03/04/2015  CLINICAL DATA:  Initial evaluation for acute speech changes. EXAM: MRI HEAD WITHOUT CONTRAST MRA HEAD WITHOUT CONTRAST TECHNIQUE: Multiplanar, multiecho pulse sequences of the brain and surrounding structures were obtained without intravenous contrast. Angiographic images of the head were obtained using MRA technique without contrast. COMPARISON:  Prior CT from 03/01/2014 FINDINGS: MRI HEAD FINDINGS Diffuse prominence of the CSF containing spaces compatible with generalized cerebral atrophy, within normal limits for patient age. Patchy T2/FLAIR hyperintensity within the periventricular and deep white matter both cerebral hemispheres most consistent with chronic small vessel ischemic disease, mild in nature. Small vessel type changes present within the pons as well. Small remote lacunar infarcts within the bilateral thalami. There is moderate-sized confluent restricted diffusion within the posterior left temporal  lobe, consistent with acute left MCA territory infarct. Patchy cortical infarct within the posterior insula. Additional scattered cortical infarcts more superiorly within the left frontoparietal region. Involvement of the sensory strip. Associated gyral swelling without significant edema. No associated hemorrhage. Major intracranial vascular flow voids maintained. No other infarct. No mass lesion, midline shift, or mass effect. No hydrocephalus. No extra-axial fluid collection. Craniocervical junction within normal limits. Pituitary gland normal. No acute abnormality about the orbits. Sequela prior bilateral lens extraction noted. Mild to moderate mucosal thickening within the left sphenoid sinus. Paranasal sinuses are otherwise clear. Trace opacity  within the mastoid air cells bilaterally. Inner ear structures grossly normal. Bone marrow signal intensity within normal limits. No scalp soft tissue abnormality. MRA HEAD FINDINGS ANTERIOR CIRCULATION: Visualized distal cervical segments of the internal carotid arteries are patent with antegrade flow. Petrous, cavernous, and supraclinoid segments widely patent. A1 segments well opacified. Anterior communicating artery normal. Anterior cerebral arteries patent to their distal aspects. M1 segments patent without occlusion. Short-segment mild stenosis within the mid left M1 segment (series 304, image 13). No other focal stenosis. MCA bifurcations normal. Distal MCA branches well opacified. No proximal M2 branch occlusion. POSTERIOR CIRCULATION: Vertebral arteries patent to the vertebrobasilar junction. Right vertebral artery is dominant. Posterior inferior cerebral arteries patent. Dominant left anterior inferior cerebral artery noted. Basilar artery well opacified. Superior cerebellar arteries patent bilaterally. Both posterior cerebral arteries arise from the basilar artery and are well opacified to their distal aspects. Single short segment mild stenosis within the proximal right PCA (series 306, image 14). No aneurysm. IMPRESSION: MRI HEAD IMPRESSION: 1. Acute ischemic nonhemorrhagic moderate-sized left MCA territory infarct. 2. Mild chronic microvascular ischemic disease. MRA HEAD IMPRESSION: 1. No large or proximal arterial branch occlusion. 2. Single subtle short-segment mild stenosis within the mid left M1 segment, with additional focal mild stenosis within the proximal right PCA. Otherwise normal intracranial MRA. Electronically Signed   By: Rise Mu M.D.   On: 03/04/2015 00:06   Mr Maxine Glenn Head/brain Wo Cm  03/04/2015  CLINICAL DATA:  Initial evaluation for acute speech changes. EXAM: MRI HEAD WITHOUT CONTRAST MRA HEAD WITHOUT CONTRAST TECHNIQUE: Multiplanar, multiecho pulse sequences of the  brain and surrounding structures were obtained without intravenous contrast. Angiographic images of the head were obtained using MRA technique without contrast. COMPARISON:  Prior CT from 03/01/2014 FINDINGS: MRI HEAD FINDINGS Diffuse prominence of the CSF containing spaces compatible with generalized cerebral atrophy, within normal limits for patient age. Patchy T2/FLAIR hyperintensity within the periventricular and deep white matter both cerebral hemispheres most consistent with chronic small vessel ischemic disease, mild in nature. Small vessel type changes present within the pons as well. Small remote lacunar infarcts within the bilateral thalami. There is moderate-sized confluent restricted diffusion within the posterior left temporal lobe, consistent with acute left MCA territory infarct. Patchy cortical infarct within the posterior insula. Additional scattered cortical infarcts more superiorly within the left frontoparietal region. Involvement of the sensory strip. Associated gyral swelling without significant edema. No associated hemorrhage. Major intracranial vascular flow voids maintained. No other infarct. No mass lesion, midline shift, or mass effect. No hydrocephalus. No extra-axial fluid collection. Craniocervical junction within normal limits. Pituitary gland normal. No acute abnormality about the orbits. Sequela prior bilateral lens extraction noted. Mild to moderate mucosal thickening within the left sphenoid sinus. Paranasal sinuses are otherwise clear. Trace opacity within the mastoid air cells bilaterally. Inner ear structures grossly normal. Bone marrow signal intensity within normal limits. No scalp soft tissue abnormality.  MRA HEAD FINDINGS ANTERIOR CIRCULATION: Visualized distal cervical segments of the internal carotid arteries are patent with antegrade flow. Petrous, cavernous, and supraclinoid segments widely patent. A1 segments well opacified. Anterior communicating artery normal.  Anterior cerebral arteries patent to their distal aspects. M1 segments patent without occlusion. Short-segment mild stenosis within the mid left M1 segment (series 304, image 13). No other focal stenosis. MCA bifurcations normal. Distal MCA branches well opacified. No proximal M2 branch occlusion. POSTERIOR CIRCULATION: Vertebral arteries patent to the vertebrobasilar junction. Right vertebral artery is dominant. Posterior inferior cerebral arteries patent. Dominant left anterior inferior cerebral artery noted. Basilar artery well opacified. Superior cerebellar arteries patent bilaterally. Both posterior cerebral arteries arise from the basilar artery and are well opacified to their distal aspects. Single short segment mild stenosis within the proximal right PCA (series 306, image 14). No aneurysm. IMPRESSION: MRI HEAD IMPRESSION: 1. Acute ischemic nonhemorrhagic moderate-sized left MCA territory infarct. 2. Mild chronic microvascular ischemic disease. MRA HEAD IMPRESSION: 1. No large or proximal arterial branch occlusion. 2. Single subtle short-segment mild stenosis within the mid left M1 segment, with additional focal mild stenosis within the proximal right PCA. Otherwise normal intracranial MRA. Electronically Signed   By: Rise MuBenjamin  McClintock M.D.   On: 03/04/2015 00:06     CBC  Recent Labs Lab 03/02/15 2135  WBC 6.9  HGB 13.4  HCT 40.0  PLT 228  MCV 95.5  MCH 32.0  MCHC 33.5  RDW 12.2  LYMPHSABS 1.8  MONOABS 0.5  EOSABS 0.1  BASOSABS 0.1    Chemistries   Recent Labs Lab 03/02/15 2135  NA 141  K 4.0  CL 106  CO2 26  GLUCOSE 89  BUN 14  CREATININE 0.67  CALCIUM 9.2  AST 18  ALT 9*  ALKPHOS 69  BILITOT 1.2   ------------------------------------------------------------------------------------------------------------------  Recent Labs  03/03/15 0512  CHOL 158  HDL 51  LDLCALC 96  TRIG 57  CHOLHDL 3.1    Lab Results  Component Value Date   HGBA1C 5.6 03/03/2015    ------------------------------------------------------------------------------------------------------------------ No results for input(s): TSH, T4TOTAL, T3FREE, THYROIDAB in the last 72 hours.  Invalid input(s): FREET3 ------------------------------------------------------------------------------------------------------------------ No results for input(s): VITAMINB12, FOLATE, FERRITIN, TIBC, IRON, RETICCTPCT in the last 72 hours.  Coagulation profile  Recent Labs Lab 03/02/15 2134  INR 1.17    No results for input(s): DDIMER in the last 72 hours.  Cardiac Enzymes No results for input(s): CKMB, TROPONINI, MYOGLOBIN in the last 168 hours.  Invalid input(s): CK ------------------------------------------------------------------------------------------------------------------ No results found for: BNP  Time Spent in minutes  35   Susa RaringSINGH,PRASHANT K M.D on 03/05/2015 at 10:38 AM  Between 7am to 7pm - Pager - [phone]  After 7pm go to [email or web address] - password [email or web address]  [facility] -  Office  [phone]

## 2015-03-06 NOTE — Progress Notes (Signed)
STROKE TEAM PROGRESS NOTE   HISTORY DOCUMENTED AT TIME OF INITIAL NEUROLOGY ASSESSMENT Allison Marshall is an 82 y.o. female with history of Parkinson's disease as well as peptic ulcer disease with upper GI hemorrhage, brought to the ED following acute onset of speech difficulty. Patient had difficulty formulating words as well as unintelligible speech at times. She had no difficulty understanding what was being said to her. She has no previous history of stroke nor TIA. There was no focal weakness. She has not been on antiplatelet therapy. CT scan of the head showed no focal left frontotemporal hypodensity, likely acute ischemic stroke. NIH stroke score was 3. She was LKW 9 AM on 03/02/2015. Initially presented to [facility], transferred to [facility] for stroke workup. Patient was not administered TPA secondary to delay in arrival . She was admitted for further evaluation and treatment.   SUBJECTIVE (INTERVAL HISTORY)  No family is at the bedside.  Overall she feels her condition is stable. Her RN was at the bedside.  We assisted her to the bathroom.  There were a few moments when patient exhibited retropulsion   OBJECTIVE Temp:  [97.5 F (36.4 C)-98.2 F (36.8 C)] 97.6 F (36.4 C) (01/15 0914) Pulse Rate:  [63-94] 94 (01/15 0914) Cardiac Rhythm:  [-] Heart block (01/15 0702) Resp:  [14-16] 14 (01/15 0914) BP: (101-129)/(53-84) 129/64 mmHg (01/15 0914) SpO2:  [97 %-100 %] 99 % (01/15 0914)  CBC:   Recent Labs Lab 03/02/15 2135  WBC 6.9  NEUTROABS 4.6  HGB 13.4  HCT 40.0  MCV 95.5  PLT 228    Basic Metabolic Panel:   Recent Labs Lab 03/02/15 2135  NA 141  K 4.0  CL 106  CO2 26  GLUCOSE 89  BUN 14  CREATININE 0.67  CALCIUM 9.2    Lipid Panel:     Component Value Date/Time   CHOL 158 03/03/2015 0512   TRIG 57 03/03/2015 0512   HDL 51 03/03/2015 0512   CHOLHDL 3.1 03/03/2015 0512   VLDL 11 03/03/2015 0512   LDLCALC 96 03/03/2015 0512   HgbA1c:  Lab Results  Component  Value Date   HGBA1C 5.6 03/03/2015   Urine Drug Screen: No results found for: LABOPIA, COCAINSCRNUR, LABBENZ, AMPHETMU, THCU, LABBARB     IMAGING  Dg Chest 2 View 03/02/2015    Cardiomegaly.  Hiatal hernia.    Ct Head Wo Contrast 03/02/2015    Focal left posterior temporal lobe hypodensity compatible with an infarct, likely acute or subacute. Clinical correlation is recommended. CT angiography or MRI is recommended for better evaluation. No acute intracranial hemorrhage.    MRI HEAD  03/04/2015    1. Acute ischemic nonhemorrhagic moderate-sized left MCA territory infarct.  2. Mild chronic microvascular ischemic disease.   MRA HEAD  03/04/2015    1. No large or proximal arterial branch occlusion.  2. Single subtle short-segment mild stenosis within the mid left M1 segment, with additional focal mild stenosis within the proximal right PCA. Otherwise normal intracranial MRA.    Carotid Doppler   There is 1-39% bilateral ICA stenosis. Vertebral artery flow is antegrade.     2D Echocardiogram  - Left ventricle: The cavity size was normal. Systolic function wasnormal. The estimated ejection fraction was in the range of 60%to 65%. Wall motion was normal; there were no regional wall motion abnormalities. Doppler parameters are consistent withabnormal left ventricular relaxation (grade 1 diastolicdysfunction). - Aortic valve: There is an abnormal partially mobile lineardensity on the aortic side  of the aortic valve. It may be an offaxis view of the other leaflet, but TEE is recommended to exclude tumor or other mass. There was trivial regurgitation. - Mitral valve: Mildly calcified annulus. There was mildregurgitation. - Left atrium: The atrium was moderately dilated. - Tricuspid valve: There was mild-moderate regurgitation. - Pulmonary arteries: Systolic pressure was moderately increased.PA peak pressure: 48 mm Hg (S).    PHYSICAL EXAM [name]  sitting in a bedside chair. . Afebrile. Head is nontraumatic. Neck is supple without bruit.    Cardiac exam no murmur or gallop. Lungs are clear to auscultation. Distal pulses are well felt. Neurological Exam :  Awake alert oriented 3. Diminished attension and restoration and recall. Speech and language appear normal. No aphasia or apraxia dysarthria. Able to name repeat and comprehend quite well. Extraocular movements are full range without nystagmus. Motor system exam reveals symmetric upper and lower extremity strength. No focal weakness. Mild intermittent resting tremor right upper molar left upper extremity and tremor also involves the jaw and lower lip. Mild cogwheel rigidity at the right wrist. Deep tendon reflexes are 1+ symmetric. Plantars downgoing. Coordination slow but accurate. Gait was not tested.    ASSESSMENT/PLAN Ms. Allison Marshall is a 82 y.o. female with history of Parkinson's disease and peptic ulcer disease with upper GI hemorrhage [facility]. She did not receive IV t-PA due to delay in arrival.   Stroke:  left MCA infarct embolic secondary to unknown source  Resultant  Speech difficulty (dysarthria) resolving  MRI  L MCA infarct  MRA  No large vessel occlusion, focal stenosis L M1 and prox R PCA  Carotid Doppler  No significant stenosis   2D Echo  abnormal partially mobile lineardensity on the aortic side of the aortic valve, TEE is recommended -   TEE requested. Anticipate it will be done on Monday. NPO after midnight.  LDL 96  HgbA1c 5.6  Lovenox 40 mg sq daily for VTE prophylaxis Diet Heart Room service appropriate?: Yes with Assist; Fluid consistency:: Thin Diet NPO time specified  No antithrombotic prior to admission, now on aspirin 325 mg daily. Recommend change to plavix alone  No further embolic workup as she is not a good long-term anticoagulation candidate  Ongoing  aggressive stroke risk factor management  Therapy recommendations:  HH PT  Disposition:  pending   Hypertension  Stable  Hyperlipidemia  Home meds:  No statin  LDL 96, goal < 70  Added pravachol 20  Continue statin at discharge   Other Stroke Risk Factors  Advanced age  Other Active Problems  Parkinson's Disease  Hx GIB secondary to PUD  Hospital day # 4  Muhanad Torosyan L Burnadette PeterINEHULS  [facility] See Amion for Pager information 03/06/2015 1:17 PM   I have personally examined this patient, reviewed notes, independently viewed imaging studies, participated in medical decision making and plan of care. I have made any additions or clarifications directly to the above note. Agree with note above. She presented with acute onset speech output difficulty is due to an embolic left MCA branch infarct etiology to be determined. She remains at risk for neurological worsening, recurrent stroke, TIA needs ongoing stroke evaluation and aggressive risk factor modification. Recommend Plavix instead of aspirin given history of upper GI hemorrhage.   Patient awaiting TEE  Sula Sodahere Gregory, MD [facility] Pager: [phone] 03/06/2015 1:17 PM    To contact  Stroke Continuity provider, please refer to [email or web address] After hours, contact General Neurology

## 2015-03-06 NOTE — Progress Notes (Signed)
Patient Demographics:    Allison Marshall, is a 82 y.o. female, DOB - Jan 02, 1927, ZOX:[id number]  Admit date - 03/02/2015   Admitting Physician Lorretta Harp, MD  Outpatient Primary MD for the patient is [name], MD  LOS - 4   Chief Complaint  Patient presents with  . Altered Mental Status        Subjective:    Allison Marshall today has, No headache, No chest pain, No abdominal pain - No Nausea, No new weakness tingling or numbness, No Cough - SOB.     Assessment  & Plan :     1. 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 Neurology on board, full stroke protocol underway, continue aspirin, statin added as LDL was above goal, will be evaluated by PT-OT and speech therapy. Most likely require SNF. Echogram and carotid duplex stable, A1c 5.6, pending TEE requested by neurology likely to be done late on Monday or early Tuesday thereafter SNF discharge.  2. Parkinson's disease. Stable continue home medication which is carbidopa-levodopa combination. PT and supportive care.  3. Hypertension. Allow for permissive hypertension due to #1 above, as needed IV hydralazine.  4. GERD and history of GI bleed. Place on PPI.    Code Status : Full  Family Communication  :  Daughter & son-in-law's bedside  Disposition Plan  : SNF after TEE likely late on Monday or Tuesday  Consults  :  Neuro  Procedures  :   TEE  CT and MRI brain consistent with left MCA territory embolic infarct.  TTE  Left ventricle: The cavity size was normal. Systolic function was normal. The estimated ejection fraction was in the range of 60% to 65%. Wall motion was normal; there were no regional wallmotion abnormalities. Doppler parameters are consistent withabnormal left ventricular relaxation (grade 1  diastolicdysfunction). - Aortic valve: There is an abnormal partially mobile lineardensity on the aortic side of the aortic valve. It may be an offaxis view of the other leaflet, but TEE is recommended to excludetumor or other mass. There was trivial regurgitation. - Mitral valve: Mildly calcified annulus. There was mildregurgitation. - Left atrium: The atrium was moderately dilated. - Tricuspid valve: There was mild-moderate regurgitation. - Pulmonary arteries: Systolic pressure was moderately increased. PA peak pressure: 48 mm Hg (S).  Recommendations: Recommend transesophageal echocardiography if clinically indicated in order to further evaluate the aortic valve.   Carotids - Bilateral - 1% to 39% ICA stenosis lower end of range. Vertebral artery flow is antegrade.    DVT Prophylaxis  :  Lovenox    Lab Results  Component Value Date   PLT 228 03/02/2015    Inpatient Medications  Scheduled Meds: .  stroke: mapping our early stages of recovery book   Does not apply Once  . aspirin  325 mg Oral Daily  . carbidopa-levodopa  1 tablet Oral Daily  . cholecalciferol  400 Units Oral Daily  . enoxaparin (LOVENOX) injection  40 mg Subcutaneous Q24H  . pantoprazole  40 mg Oral Daily  . pravastatin  20 mg Oral q1800   Continuous Infusions:  PRN Meds:.senna-docusate  Antibiotics  :     Anti-infectives    None        Objective:   Filed Vitals:  03/05/15 2202 03/06/15 0114 03/06/15 0608 03/06/15 0914  BP: 125/58 125/61 115/84 129/64  Pulse: 74 72 63 94  Temp: 98.2 F (36.8 C) 97.6 F (36.4 C) 97.5 F (36.4 C) 97.6 F (36.4 C)  TempSrc: Oral Oral Oral Oral  Resp: 15 14 15 14   SpO2: 97% 98% 100% 99%    Wt Readings from Last 3 Encounters:  No data found for Wt    No intake or output data in the 24 hours ending 03/06/15 1254   Physical Exam  Awake Alert, Oriented X 3, No new F.N deficits, Normal affect, mild dysarthria but generalized resting tremors mostly in  upper extremities [place].AT,PERRAL Supple Neck,No JVD, No cervical lymphadenopathy appriciated.  Symmetrical Chest wall movement, Good air movement bilaterally, CTAB RRR,No Gallops,Rubs or new Murmurs, No Parasternal Heave +ve B.Sounds, Abd Soft, No tenderness, No organomegaly appriciated, No rebound - guarding or rigidity. No Cyanosis, Clubbing or edema, No new Rash or bruise       Data Review:   Micro Results No results found for this or any previous visit (from the past 240 hour(s)).  Radiology Reports Dg Chest 2 View  03/02/2015  CLINICAL DATA:  Altered mental status EXAM: CHEST  2 VIEW COMPARISON:  None. FINDINGS: The heart is enlarged. Hiatal hernia is present. There are no focal consolidations or pleural effusions. No pulmonary edema. IMPRESSION: Cardiomegaly.  Hiatal hernia. Electronically Signed   By: Norva Pavlov M.D.   On: 03/02/2015 21:43   Ct Head Wo Contrast  03/02/2015  CLINICAL DATA:  82 year old female with Parkinson's presenting with change in mental status and difficulty with speech EXAM: CT HEAD WITHOUT CONTRAST TECHNIQUE: Contiguous axial images were obtained from the base of the skull through the vertex without intravenous contrast. COMPARISON:  None FINDINGS: The ventricles and sulci are appropriate in size for patient's age. Mild periventricular and deep white matter hypodensities represent chronic microvascular ischemic changes. There is a focal area of hypodensity in the posterior left temporal lobe compatible with an acute or subacute infarct. Clinical correlation is recommended. There is no intracranial hemorrhage. No mass effect or midline shift identified. The visualized paranasal sinuses and mastoid air cells are well aerated. The calvarium is intact. IMPRESSION: Focal left posterior temporal lobe hypodensity compatible with an infarct, likely acute or subacute. Clinical correlation is recommended. CT angiography or MRI is recommended for better evaluation. No acute  intracranial hemorrhage. Critical Value/emergent results were called by telephone at the time of interpretation on 03/02/2015 at 10:40 pm to Dr. Tyrone Apple , who verbally acknowledged these results. Electronically Signed   By: Elgie Collard M.D.   On: 03/02/2015 22:40   Mr Brain Wo Contrast  03/04/2015  CLINICAL DATA:  Initial evaluation for acute speech changes. EXAM: MRI HEAD WITHOUT CONTRAST MRA HEAD WITHOUT CONTRAST TECHNIQUE: Multiplanar, multiecho pulse sequences of the brain and surrounding structures were obtained without intravenous contrast. Angiographic images of the head were obtained using MRA technique without contrast. COMPARISON:  Prior CT from 03/01/2014 FINDINGS: MRI HEAD FINDINGS Diffuse prominence of the CSF containing spaces compatible with generalized cerebral atrophy, within normal limits for patient age. Patchy T2/FLAIR hyperintensity within the periventricular and deep white matter both cerebral hemispheres most consistent with chronic small vessel ischemic disease, mild in nature. Small vessel type changes present within the pons as well. Small remote lacunar infarcts within the bilateral thalami. There is moderate-sized confluent restricted diffusion within the posterior left temporal lobe, consistent with acute left MCA territory infarct. Patchy cortical  infarct within the posterior insula. Additional scattered cortical infarcts more superiorly within the left frontoparietal region. Involvement of the sensory strip. Associated gyral swelling without significant edema. No associated hemorrhage. Major intracranial vascular flow voids maintained. No other infarct. No mass lesion, midline shift, or mass effect. No hydrocephalus. No extra-axial fluid collection. Craniocervical junction within normal limits. Pituitary gland normal. No acute abnormality about the orbits. Sequela prior bilateral lens extraction noted. Mild to moderate mucosal thickening within the left sphenoid sinus.  Paranasal sinuses are otherwise clear. Trace opacity within the mastoid air cells bilaterally. Inner ear structures grossly normal. Bone marrow signal intensity within normal limits. No scalp soft tissue abnormality. MRA HEAD FINDINGS ANTERIOR CIRCULATION: Visualized distal cervical segments of the internal carotid arteries are patent with antegrade flow. Petrous, cavernous, and supraclinoid segments widely patent. A1 segments well opacified. Anterior communicating artery normal. Anterior cerebral arteries patent to their distal aspects. M1 segments patent without occlusion. Short-segment mild stenosis within the mid left M1 segment (series 304, image 13). No other focal stenosis. MCA bifurcations normal. Distal MCA branches well opacified. No proximal M2 branch occlusion. POSTERIOR CIRCULATION: Vertebral arteries patent to the vertebrobasilar junction. Right vertebral artery is dominant. Posterior inferior cerebral arteries patent. Dominant left anterior inferior cerebral artery noted. Basilar artery well opacified. Superior cerebellar arteries patent bilaterally. Both posterior cerebral arteries arise from the basilar artery and are well opacified to their distal aspects. Single short segment mild stenosis within the proximal right PCA (series 306, image 14). No aneurysm. IMPRESSION: MRI HEAD IMPRESSION: 1. Acute ischemic nonhemorrhagic moderate-sized left MCA territory infarct. 2. Mild chronic microvascular ischemic disease. MRA HEAD IMPRESSION: 1. No large or proximal arterial branch occlusion. 2. Single subtle short-segment mild stenosis within the mid left M1 segment, with additional focal mild stenosis within the proximal right PCA. Otherwise normal intracranial MRA. Electronically Signed   By: Rise MuBenjamin  McClintock M.D.   On: 03/04/2015 00:06   Mr Maxine GlennMra Head/brain Wo Cm  03/04/2015  CLINICAL DATA:  Initial evaluation for acute speech changes. EXAM: MRI HEAD WITHOUT CONTRAST MRA HEAD WITHOUT CONTRAST  TECHNIQUE: Multiplanar, multiecho pulse sequences of the brain and surrounding structures were obtained without intravenous contrast. Angiographic images of the head were obtained using MRA technique without contrast. COMPARISON:  Prior CT from 03/01/2014 FINDINGS: MRI HEAD FINDINGS Diffuse prominence of the CSF containing spaces compatible with generalized cerebral atrophy, within normal limits for patient age. Patchy T2/FLAIR hyperintensity within the periventricular and deep white matter both cerebral hemispheres most consistent with chronic small vessel ischemic disease, mild in nature. Small vessel type changes present within the pons as well. Small remote lacunar infarcts within the bilateral thalami. There is moderate-sized confluent restricted diffusion within the posterior left temporal lobe, consistent with acute left MCA territory infarct. Patchy cortical infarct within the posterior insula. Additional scattered cortical infarcts more superiorly within the left frontoparietal region. Involvement of the sensory strip. Associated gyral swelling without significant edema. No associated hemorrhage. Major intracranial vascular flow voids maintained. No other infarct. No mass lesion, midline shift, or mass effect. No hydrocephalus. No extra-axial fluid collection. Craniocervical junction within normal limits. Pituitary gland normal. No acute abnormality about the orbits. Sequela prior bilateral lens extraction noted. Mild to moderate mucosal thickening within the left sphenoid sinus. Paranasal sinuses are otherwise clear. Trace opacity within the mastoid air cells bilaterally. Inner ear structures grossly normal. Bone marrow signal intensity within normal limits. No scalp soft tissue abnormality. MRA HEAD FINDINGS ANTERIOR CIRCULATION: Visualized distal cervical segments of  the internal carotid arteries are patent with antegrade flow. Petrous, cavernous, and supraclinoid segments widely patent. A1 segments well  opacified. Anterior communicating artery normal. Anterior cerebral arteries patent to their distal aspects. M1 segments patent without occlusion. Short-segment mild stenosis within the mid left M1 segment (series 304, image 13). No other focal stenosis. MCA bifurcations normal. Distal MCA branches well opacified. No proximal M2 branch occlusion. POSTERIOR CIRCULATION: Vertebral arteries patent to the vertebrobasilar junction. Right vertebral artery is dominant. Posterior inferior cerebral arteries patent. Dominant left anterior inferior cerebral artery noted. Basilar artery well opacified. Superior cerebellar arteries patent bilaterally. Both posterior cerebral arteries arise from the basilar artery and are well opacified to their distal aspects. Single short segment mild stenosis within the proximal right PCA (series 306, image 14). No aneurysm. IMPRESSION: MRI HEAD IMPRESSION: 1. Acute ischemic nonhemorrhagic moderate-sized left MCA territory infarct. 2. Mild chronic microvascular ischemic disease. MRA HEAD IMPRESSION: 1. No large or proximal arterial branch occlusion. 2. Single subtle short-segment mild stenosis within the mid left M1 segment, with additional focal mild stenosis within the proximal right PCA. Otherwise normal intracranial MRA. Electronically Signed   By: Rise Mu M.D.   On: 03/04/2015 00:06     CBC  Recent Labs Lab 03/02/15 2135  WBC 6.9  HGB 13.4  HCT 40.0  PLT 228  MCV 95.5  MCH 32.0  MCHC 33.5  RDW 12.2  LYMPHSABS 1.8  MONOABS 0.5  EOSABS 0.1  BASOSABS 0.1    Chemistries   Recent Labs Lab 03/02/15 2135  NA 141  K 4.0  CL 106  CO2 26  GLUCOSE 89  BUN 14  CREATININE 0.67  CALCIUM 9.2  AST 18  ALT 9*  ALKPHOS 69  BILITOT 1.2   ------------------------------------------------------------------------------------------------------------------ No results for input(s): CHOL, HDL, LDLCALC, TRIG, CHOLHDL, LDLDIRECT in the last 72 hours.  Lab Results   Component Value Date   HGBA1C 5.6 03/03/2015   ------------------------------------------------------------------------------------------------------------------ No results for input(s): TSH, T4TOTAL, T3FREE, THYROIDAB in the last 72 hours.  Invalid input(s): FREET3 ------------------------------------------------------------------------------------------------------------------ No results for input(s): VITAMINB12, FOLATE, FERRITIN, TIBC, IRON, RETICCTPCT in the last 72 hours.  Coagulation profile  Recent Labs Lab 03/02/15 2134  INR 1.17    No results for input(s): DDIMER in the last 72 hours.  Cardiac Enzymes No results for input(s): CKMB, TROPONINI, MYOGLOBIN in the last 168 hours.  Invalid input(s): CK ------------------------------------------------------------------------------------------------------------------ No results found for: BNP  Time Spent in minutes  35   SINGH,PRASHANT K M.D on 03/06/2015 at 12:54 PM  Between 7am to 7pm - Pager - [phone]  After 7pm go to [email or web address] - password [facility]  [facility] -  Office  [phone]

## 2015-03-07 ENCOUNTER — Encounter (HOSPITAL_COMMUNITY)
Admission: EM | Disposition: A | Discharge status: Home Health | Payer: Self-pay | Source: Home / Self Care | Attending: Internal Medicine

## 2015-03-07 ENCOUNTER — Encounter (HOSPITAL_COMMUNITY): Payer: Self-pay | Admitting: *Deleted

## 2015-03-07 ENCOUNTER — Inpatient Hospital Stay (HOSPITAL_COMMUNITY): Payer: Medicare Other

## 2015-03-07 DIAGNOSIS — I340 Nonrheumatic mitral (valve) insufficiency: Secondary | ICD-10-CM

## 2015-03-07 DIAGNOSIS — I639 Cerebral infarction, unspecified: Secondary | ICD-10-CM

## 2015-03-07 HISTORY — PX: TEE WITHOUT CARDIOVERSION: SHX5443

## 2015-03-07 SURGERY — ECHOCARDIOGRAM, TRANSESOPHAGEAL
Anesthesia: Moderate Sedation

## 2015-03-07 MED ORDER — FLUMAZENIL 0.5 MG/5ML IV SOLN
INTRAVENOUS | Status: AC
  Filled 2015-03-07: qty 5

## 2015-03-07 MED ORDER — BUTAMBEN-TETRACAINE-BENZOCAINE 2-2-14 % EX AERO
INHALATION_SPRAY | CUTANEOUS | Status: DC | PRN
  Administered 2015-03-07: 2 via TOPICAL

## 2015-03-07 MED ORDER — MIDAZOLAM HCL 5 MG/ML IJ SOLN
INTRAMUSCULAR | Status: AC
  Filled 2015-03-07: qty 2

## 2015-03-07 MED ORDER — ASPIRIN 325 MG PO TABS: 325.0000 mg | ORAL_TABLET | Freq: Every day | ORAL | Status: DC

## 2015-03-07 MED ORDER — MIDAZOLAM HCL 10 MG/2ML IJ SOLN
INTRAMUSCULAR | Status: DC | PRN
  Administered 2015-03-07: 2 mg via INTRAVENOUS
  Administered 2015-03-07: 1 mg via INTRAVENOUS

## 2015-03-07 MED ORDER — DIPHENHYDRAMINE HCL 50 MG/ML IJ SOLN
INTRAMUSCULAR | Status: AC
  Filled 2015-03-07: qty 1

## 2015-03-07 MED ORDER — FENTANYL CITRATE (PF) 100 MCG/2ML IJ SOLN
INTRAMUSCULAR | Status: AC
  Filled 2015-03-07: qty 2

## 2015-03-07 MED ORDER — PANTOPRAZOLE SODIUM 40 MG PO TBEC: 40.0000 mg | DELAYED_RELEASE_TABLET | Freq: Every day | ORAL | Status: AC

## 2015-03-07 MED ORDER — FLUMAZENIL 0.5 MG/5ML IV SOLN
INTRAVENOUS | Status: DC | PRN
  Administered 2015-03-07: .1 mg via INTRAVENOUS

## 2015-03-07 MED ORDER — PRAVASTATIN SODIUM 20 MG PO TABS: 20.0000 mg | ORAL_TABLET | Freq: Every day | ORAL | Status: AC

## 2015-03-07 NOTE — Care Management Note (Signed)
Case Management Note  Patient Details  Name: Allison Marshall MRN: [id number] Date of Birth: 10-04-1926  Subjective/Objective:                    Action/Plan: Plan is for patient to discharge home with family and home health services. CM met with the patient and her daughter and provided them a list of home health agencies in the [facility] area. They selected [place]. Tiffany with [facility] notified and accepted the referral. CM also left for the family a list of Private Duty agencies in case they need more support. CM did inform them that the private duty agencies are self pay. Bedside RN updated.    Expected Discharge Date:   (unknown)               Expected Discharge Plan:  [place]  In-House Referral:     Discharge planning Services  CM Consult  Post Acute Care Choice:  Home Health Choice offered to:  Patient, Adult Children  DME Arranged:    DME Agency:     HH Arranged:  RN, PT, OT, Nurse's Aide, Speech Therapy, Social Work [employer] Agency:  [place]  Status of Service:  Completed, signed off  Medicare Important Message Given:  Yes Date Medicare IM Given:    Medicare IM give by:    Date Additional Medicare IM Given:    Additional Medicare Important Message give by:     If discussed at [place] of Stay Meetings, dates discussed:    Additional Comments:  Pollie Friar, RN 03/07/2015, 4:01 PM

## 2015-03-07 NOTE — Progress Notes (Signed)
PT Cancellation Note  Patient Details Name: [name] MRN: [id number] DOB: 03/12/1926   Cancelled Treatment:    Reason Eval/Treat Not Completed: Patient off unit earlier attempt; just returned to unit with sedation effects lingering.  Noted discrepancy with PT, OT recommendations for disposition. Will try to work with patient this pm to further assess.   Jayson Waterhouse 03/07/2015, 2:15 PM  Pager [phone]

## 2015-03-07 NOTE — Discharge Instructions (Signed)
Follow with Primary MD Aida PufferLITTLE,JAMES, MD in 7 days   Get CBC, CMP, 2 view Chest X ray checked  by Primary MD next visit.    Activity: As tolerated with Full fall precautions use walker/cane & assistance as needed   Disposition HHPT   Diet:   Heart Healthy  with feeding assistance and aspiration precautions.  For Heart failure patients - Check your Weight same time everyday, if you gain over 2 pounds, or you develop in leg swelling, experience more shortness of breath or chest pain, call your Primary MD immediately. Follow Cardiac Low Salt Diet and 1.5 lit/day fluid restriction.   On your next visit with your primary care physician please Get Medicines reviewed and adjusted.   Please request your Prim.MD to go over all Hospital Tests and Procedure/Radiological results at the follow up, please get all Hospital records sent to your Prim MD by signing hospital release before you go home.   If you experience worsening of your admission symptoms, develop shortness of breath, life threatening emergency, suicidal or homicidal thoughts you must seek medical attention immediately by calling 911 or calling your MD immediately  if symptoms less severe.  You Must read complete instructions/literature along with all the possible adverse reactions/side effects for all the Medicines you take and that have been prescribed to you. Take any new Medicines after you have completely understood and accpet all the possible adverse reactions/side effects.   Do not drive, operating heavy machinery, perform activities at heights, swimming or participation in water activities or provide baby sitting services if your were admitted for syncope or siezures until you have seen by Primary MD or a Neurologist and advised to do so again.  Do not drive when taking Pain medications.    Do not take more than prescribed Pain, Sleep and Anxiety Medications  Special Instructions: If you have smoked or chewed Tobacco  in the  last 2 yrs please stop smoking, stop any regular Alcohol  and or any Recreational drug use.  Wear Seat belts while driving.   Please note  You were cared for by a hospitalist during your hospital stay. If you have any questions about your discharge medications or the care you received while you were in the hospital after you are discharged, you can call the unit and asked to speak with the hospitalist on call if the hospitalist that took care of you is not available. Once you are discharged, your primary care physician will handle any further medical issues. Please note that NO REFILLS for any discharge medications will be authorized once you are discharged, as it is imperative that you return to your primary care physician (or establish a relationship with a primary care physician if you do not have one) for your aftercare needs so that they can reassess your need for medications and monitor your lab values.

## 2015-03-07 NOTE — Progress Notes (Signed)
Pt having TEE . Dr. Eden EmmsNishan at bedside. Pt vitals stable throughout. BP dropped into 80's. Sat 100%. Having to use chin left to keep C02 around 30. Pt not responsive to verbal cues. Dr. Eden EmmsNishan ordered 1 dose flumazenil and give 2nd if patient does not wake up. 1224 .1mg  Flumazenil given IV. Within 1 minute of giving flumazenil patient begins to respond and talk to me. SBP is now over 100. See vital signs flowsheet. Will continue to monitor.

## 2015-03-07 NOTE — Progress Notes (Signed)
Patient talking with family and states feels fine without complaint. Vital signs have been stable. Current bp is 138/81 o2 sat 99% on RA. Patient will be transported back to room.

## 2015-03-07 NOTE — Interval H&P Note (Signed)
History and Physical Interval Note:  03/07/2015 8:56 AM  Allison Marshall  has presented today for surgery, with the diagnosis of stroke  The various methods of treatment have been discussed with the patient and family. After consideration of risks, benefits and other options for treatment, the patient has consented to  Procedure(s): TRANSESOPHAGEAL ECHOCARDIOGRAM (TEE) (N/A) as a surgical intervention .  The patient's history has been reviewed, patient examined, no change in status, stable for surgery.  I have reviewed the patient's chart and labs.  Questions were answered to the patient's satisfaction.     Charlton HawsPeter Echo Propp

## 2015-03-07 NOTE — CV Procedure (Signed)
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 Not a candidate for closure given age No effusion Moderate mural aortic debris  Charlton HawsPeter Stevens [place]

## 2015-03-07 NOTE — Progress Notes (Signed)
STROKE TEAM PROGRESS NOTE    SUBJECTIVE (INTERVAL HISTORY)  No family is at the bedside. Patient awake, talkative. Resting tremor. Admits to mild memory deficit.   OBJECTIVE Temp:  [97.6 F (36.4 C)-98.1 F (36.7 C)] 97.7 F (36.5 C) (01/16 0615) Pulse Rate:  [62-86] 70 (01/16 0615) Cardiac Rhythm:  [-] Heart block (01/16 0700) Resp:  [14-16] 16 (01/16 0615) BP: (100-150)/(53-76) 150/73 mmHg (01/16 0615) SpO2:  [97 %-99 %] 98 % (01/16 0615)  CBC:   Recent Labs Lab 03/02/15 2135  WBC 6.9  NEUTROABS 4.6  HGB 13.4  HCT 40.0  MCV 95.5  PLT 228    Basic Metabolic Panel:   Recent Labs Lab 03/02/15 2135  NA 141  K 4.0  CL 106  CO2 26  GLUCOSE 89  BUN 14  CREATININE 0.67  CALCIUM 9.2    Lipid Panel:     Component Value Date/Time   CHOL 158 03/03/2015 0512   TRIG 57 03/03/2015 0512   HDL 51 03/03/2015 0512   CHOLHDL 3.1 03/03/2015 0512   VLDL 11 03/03/2015 0512   LDLCALC 96 03/03/2015 0512   HgbA1c:  Lab Results  Component Value Date   HGBA1C 5.6 03/03/2015   Urine Drug Screen: No results found for: LABOPIA, COCAINSCRNUR, LABBENZ, AMPHETMU, THCU, LABBARB     IMAGING  Dg Chest 2 View 03/02/2015    Cardiomegaly.  Hiatal hernia.    Ct Head Wo Contrast 03/02/2015    Focal left posterior temporal lobe hypodensity compatible with an infarct, likely acute or subacute. Clinical correlation is recommended. CT angiography or MRI is recommended for better evaluation. No acute intracranial hemorrhage.    MRI HEAD  03/04/2015    1. Acute ischemic nonhemorrhagic moderate-sized left MCA territory infarct.  2. Mild chronic microvascular ischemic disease.   MRA HEAD  03/04/2015    1. No large or proximal arterial branch occlusion.  2. Single subtle short-segment mild stenosis within the mid left M1 segment, with additional focal mild stenosis within the proximal right PCA. Otherwise normal intracranial MRA.    Carotid Doppler   There is 1-39% bilateral ICA  stenosis. Vertebral artery flow is antegrade.     2D Echocardiogram  - Left ventricle: The cavity size was normal. Systolic function wasnormal. The estimated ejection fraction was in the range of 60%to 65%. Wall motion was normal; there were no regional wall motion abnormalities. Doppler parameters are consistent withabnormal left ventricular relaxation (grade 1 diastolicdysfunction). - Aortic valve: There is an abnormal partially mobile lineardensity on the aortic side of the aortic valve. It may be an offaxis view of the other leaflet, but TEE is recommended to exclude tumor or other mass. There was trivial regurgitation. - Mitral valve: Mildly calcified annulus. There was mildregurgitation. - Left atrium: The atrium was moderately dilated. - Tricuspid valve: There was mild-moderate regurgitation. - Pulmonary arteries: Systolic pressure was moderately increased.PA peak pressure: 48 mm Hg (S).    PHYSICAL EXAM [name] sitting in a bedside chair. . Afebrile. Head is nontraumatic. Neck is supple without bruit.    Cardiac exam no murmur or gallop. Lungs are clear to auscultation. Distal pulses are well felt. Neurological Exam :  Awake alert oriented 3. Diminished attension and restoration and recall. Speech and language appear normal. No aphasia or apraxia dysarthria. Able to name repeat and comprehend quite well. Extraocular movements are full range without nystagmus. Motor system exam reveals symmetric upper and lower extremity strength. No focal weakness. Mild intermittent  resting tremor right upper molar left upper extremity and tremor also involves the jaw and lower lip. Mild cogwheel rigidity at the right wrist. Deep tendon reflexes are 1+ symmetric. Plantars downgoing. Coordination slow but accurate. Gait was not tested.    ASSESSMENT/PLAN Allison Marshall is a 82 y.o. female with history of Parkinson's disease and peptic ulcer disease with upper  GI hemorrhage [facility] She did not receive IV t-PA due to delay in arrival.   Stroke:  left MCA infarct embolic secondary to unknown source  Resultant  Speech difficulty (dysarthria) resolving  MRI  L MCA infarct  MRA  No large vessel occlusion, focal stenosis L M1 and prox R PCA  Carotid Doppler  No significant stenosis   2D Echo  abnormal partially mobile lineardensity on the aortic side of the aortic valve, TEE is recommended   TEE scheduled today at 12n   LDL 96  HgbA1c 5.6  Lovenox 40 mg sq daily for VTE prophylaxis Diet NPO time specified  No antithrombotic prior to admission, now on aspirin 325 mg daily. Recommend change to plavix alone  No further embolic workup as she is not a good long-term anticoagulation candidate  Ongoing aggressive stroke risk factor management  Therapy recommendations:  HH PT, OT recommends SNF  Disposition:  pending   Hypertension  Stable  Hyperlipidemia  Home meds:  No statin  LDL 96, goal < 70  Added pravachol 20  Continue statin at discharge  Other Stroke Risk Factors  Advanced age  Other Active Problems  Parkinson's Disease  GERD w/ Hx GIB secondary to PUD  Hospital day # 5  Rhoderick MoodyBIBY,SHARON  [facility] See Amion for Pager information 03/07/2015 10:38 AM  I have personally examined this patient, reviewed notes, independently viewed imaging studies, participated in medical decision making and plan of care. I have made any additions or clarifications directly to the above note. Agree with note above.    Delia HeadyPramod Sethi, MD Medical Director [facility] Pager: [phone] 03/07/2015 2:59 PM   To contact Stroke Continuity provider, please refer to [email or web address]. After hours, contact General Neurology

## 2015-03-07 NOTE — Care Management Important Message (Signed)
Important Message  Patient Details  Name: Allison BottomsBertha L Marshall MRN: [id number] Date of Birth: 10/09/1926   Medicare Important Message Given:  Yes    Kairav Russomanno P Lemonte Al 03/07/2015, 4:02 PM

## 2015-03-07 NOTE — Discharge Summary (Signed)
Allison Marshall, is a 82 y.o. female  DOB January 18, 1927  MRN [id number].  Admission date:  03/02/2015  Admitting Physician  Lorretta Harp, MD  Discharge Date:  03/08/2015   Primary MD  [name], MD  Recommendations for primary care physician for things to follow:   Monitor secondary CVA risk factors   Admission Diagnosis  PUD (peptic ulcer disease) [K27.9] Stroke (cerebrum) (HCC) [I63.9] Cerebral infarction due to unspecified mechanism [I63.9]   Discharge Diagnosis  PUD (peptic ulcer disease) [K27.9] Stroke (cerebrum) (HCC) [I63.9] Cerebral infarction due to unspecified mechanism [I63.9]     Principal Problem:   Cerebral infarction due to unspecified mechanism Active Problems:   PUD (peptic ulcer disease)   Difficulty speaking   Parkinson disease (HCC)      Past Medical History  Diagnosis Date  . Parkinson disease (HCC)   . GIB (gastrointestinal bleeding)   . PUD (peptic ulcer disease)     Past Surgical History  Procedure Laterality Date  . Esophagogastroduodenoscopy    . Tee without cardioversion N/A 03/07/2015    Procedure: TRANSESOPHAGEAL ECHOCARDIOGRAM (TEE);  Surgeon: Wendall Stade, MD;  Location: [facility] ENDOSCOPY;  Service: Cardiovascular;  Laterality: N/A;       HPI  from the history and physical done on the day of admission:    Allison Marshall is a 82 y.o. female with PMH of Parkinson's disease, PUD, GI bleeding 2011, who presents with difficulty speaking.  Per her daughter, patient was last known normal at 10:30 AM, but was noticed to have difficulty speaking at 2:30 PM by her daughter. Patient has difficult to word out. Patient does not seem to have unilateral weakness, numbness or tingling sensations. No vision change or hearing loss. No seizure activities. Patient has hand shaking due  to Parkinson's disease, which has not changed. She does not have chest pain, shortness of breath, cough, abdominal pain, diarrhea, symptoms of UTI.  In ED, patient was found to have WBC 6.9, temperature normal, no tachycardia, renal function okay, chest x-ray with no infiltration, but showed hiatal hernia, negative troponin. CT head showed focal left posterior temporal lobe hypodensity compatible with an infarct, likely acute or subacute, no acute intracranial hemorrhage. Patient is admitted to inpatient for further interventional treatment. Neurology was consulted by EDP.        Hospital Course:     1. 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 Neurology on board, full stroke protocol underway, continue aspirin, statin added as LDL was above goal, will be evaluated by PT-OT and speech therapy. Most likely require SNF. Echogram and carotid duplex stable, A1c 5.6, stable TEE requested by neurology showed a PFO, Vas [place] - negative , qualifies for HHPT. Will DC on ASA-Statin with PCP and Neuro followup. Discussed TEE. findings with Dr. Pearlean Brownie.  2. Parkinson's disease. Stable continue home medication which is carbidopa-levodopa combination. PT and supportive care.  3. Hypertension. Allow for permissive hypertension due to #1 above, as needed IV hydralazine.  4. GERD and history of GI bleed.  Place on PPI.       Discharge Condition: Fair  Follow UP  Follow-up Information    Follow up with SETHI,PRAMOD, MD. Schedule an appointment as soon as possible for a visit in 1 month.   Specialties:  Neurology, Radiology   Why:  As needed   Contact information:   [address] [place] [address] [phone]       Follow up with [name], MD. Schedule an appointment as soon as possible for a visit in 1 week.   Specialty:  Family Medicine   Contact information:   [address] [place] [address] [phone]        Consults obtained -  Neuro, Cards  Diet and  Activity recommendation: See Discharge Instructions below  Discharge Instructions           Discharge Instructions    Diet - low sodium heart healthy    Complete by:  As directed      Discharge instructions    Complete by:  As directed   Follow with Primary MD [name], MD in 7 days   Get CBC, CMP, 2 view Chest X ray checked  by Primary MD next visit.    Activity: As tolerated with Full fall precautions use walker/cane & assistance as needed   Disposition HHPT   Diet:   Heart Healthy  with feeding assistance and aspiration precautions.  For Heart failure patients - Check your Weight same time everyday, if you gain over 2 pounds, or you develop in leg swelling, experience more shortness of breath or chest pain, call your Primary MD immediately. Follow Cardiac Low Salt Diet and 1.5 lit/day fluid restriction.   On your next visit with your primary care physician please Get Medicines reviewed and adjusted.   Please request your Prim.MD to go over all Hospital Tests and Procedure/Radiological results at the follow up, please get all Hospital records sent to your Prim MD by signing hospital release before you go home.   If you experience worsening of your admission symptoms, develop shortness of breath, life threatening emergency, suicidal or homicidal thoughts you must seek medical attention immediately by calling 911 or calling your MD immediately  if symptoms less severe.  You Must read complete instructions/literature along with all the possible adverse reactions/side effects for all the Medicines you take and that have been prescribed to you. Take any new Medicines after you have completely understood and accpet all the possible adverse reactions/side effects.   Do not drive, operating heavy machinery, perform activities at heights, swimming or participation in water activities or provide baby sitting services if your were admitted for syncope or siezures until you have seen  by Primary MD or a Neurologist and advised to do so again.  Do not drive when taking Pain medications.    Do not take more than prescribed Pain, Sleep and Anxiety Medications  Special Instructions: If you have smoked or chewed Tobacco  in the last 2 yrs please stop smoking, stop any regular Alcohol  and or any Recreational drug use.  Wear Seat belts while driving.   Please note  You were cared for by a hospitalist during your hospital stay. If you have any questions about your discharge medications or the care you received while you were in the hospital after you are discharged, you can call the unit and asked to speak with the hospitalist on call if the hospitalist that took care of you is not available. Once  you are discharged, your primary care physician will handle any further medical issues. Please note that NO REFILLS for any discharge medications will be authorized once you are discharged, as it is imperative that you return to your primary care physician (or establish a relationship with a primary care physician if you do not have one) for your aftercare needs so that they can reassess your need for medications and monitor your lab values.     Discharge patient    Complete by:  As directed      Increase activity slowly    Complete by:  As directed              Discharge Medications       Medication List    TAKE these medications        acetaminophen 325 MG tablet  Commonly known as:  TYLENOL  Take 325-650 mg by mouth every 6 (six) hours as needed for moderate pain.     aspirin 325 MG tablet  Take 1 tablet (325 mg total) by mouth daily.     carbidopa-levodopa 25-100 MG tablet  Commonly known as:  SINEMET IR  Take 1 tablet by mouth daily.     pantoprazole 40 MG tablet  Commonly known as:  PROTONIX  Take 1 tablet (40 mg total) by mouth daily.     pravastatin 20 MG tablet  Commonly known as:  PRAVACHOL  Take 1 tablet (20 mg total) by mouth daily at 6 PM.      VITAMIN D PO  Take 1 tablet by mouth daily.        Major procedures and Radiology Reports - PLEASE review detailed and final reports for all details, in brief -   Vas [place]  Bilateral Lower extremity venous Doppler has been completed. Bilateral: No evidence of DVT, superficial thrombosis, or Baker's Cyst   TEE   Impressions:  - Normal EF 60% Mild MR Mild AR small lambl&'s excresence on right coronary cusp Large PFO 7 mm with right to left shunt on bubble study Normal RV Moderate mural aortic debris No LAA thrombus No effusion   CT and MRI brain consistent with left MCA territory embolic infarct.  TTE  Left ventricle: The cavity size was normal. Systolic function was normal. The estimated ejection fraction was in the range of 60% to 65%. Wall motion was normal; there were no regional wallmotion abnormalities. Doppler parameters are consistent withabnormal left ventricular relaxation (grade 1 diastolicdysfunction). - Aortic valve: There is an abnormal partially mobile lineardensity on the aortic side of the aortic valve. It may be an offaxis view of the other leaflet, but TEE is recommended to excludetumor or other mass. There was trivial regurgitation. - Mitral valve: Mildly calcified annulus. There was mildregurgitation. - Left atrium: The atrium was moderately dilated. - Tricuspid valve: There was mild-moderate regurgitation. - Pulmonary arteries: Systolic pressure was moderately increased. PA peak pressure: 48 mm Hg (S).  Recommendations: Recommend transesophageal echocardiography if clinically indicated in order to further evaluate the aortic valve.   Carotids - Bilateral - 1% to 39% ICA stenosis lower end of range. Vertebral artery flow is antegrade.     Dg Chest 2 View  03/02/2015  CLINICAL DATA:  Altered mental status EXAM: CHEST  2 VIEW COMPARISON:  None. FINDINGS: The heart is enlarged. Hiatal hernia is present. There are no focal  consolidations or pleural effusions. No pulmonary edema. IMPRESSION: Cardiomegaly.  Hiatal hernia. Electronically Signed   By: Norva Pavlov M.D.  On: 03/02/2015 21:43   Ct Head Wo Contrast  03/02/2015  CLINICAL DATA:  82 year old female with Parkinson's presenting with change in mental status and difficulty with speech EXAM: CT HEAD WITHOUT CONTRAST TECHNIQUE: Contiguous axial images were obtained from the base of the skull through the vertex without intravenous contrast. COMPARISON:  None FINDINGS: The ventricles and sulci are appropriate in size for patient's age. Mild periventricular and deep white matter hypodensities represent chronic microvascular ischemic changes. There is a focal area of hypodensity in the posterior left temporal lobe compatible with an acute or subacute infarct. Clinical correlation is recommended. There is no intracranial hemorrhage. No mass effect or midline shift identified. The visualized paranasal sinuses and mastoid air cells are well aerated. The calvarium is intact. IMPRESSION: Focal left posterior temporal lobe hypodensity compatible with an infarct, likely acute or subacute. Clinical correlation is recommended. CT angiography or MRI is recommended for better evaluation. No acute intracranial hemorrhage. Critical Value/emergent results were called by telephone at the time of interpretation on 03/02/2015 at 10:40 pm to Dr. Tyrone Apple , who verbally acknowledged these results. Electronically Signed   By: Elgie Collard M.D.   On: 03/02/2015 22:40   Mr Brain Wo Contrast  03/04/2015  CLINICAL DATA:  Initial evaluation for acute speech changes. EXAM: MRI HEAD WITHOUT CONTRAST MRA HEAD WITHOUT CONTRAST TECHNIQUE: Multiplanar, multiecho pulse sequences of the brain and surrounding structures were obtained without intravenous contrast. Angiographic images of the head were obtained using MRA technique without contrast. COMPARISON:  Prior CT from 03/01/2014 FINDINGS: MRI HEAD  FINDINGS Diffuse prominence of the CSF containing spaces compatible with generalized cerebral atrophy, within normal limits for patient age. Patchy T2/FLAIR hyperintensity within the periventricular and deep white matter both cerebral hemispheres most consistent with chronic small vessel ischemic disease, mild in nature. Small vessel type changes present within the pons as well. Small remote lacunar infarcts within the bilateral thalami. There is moderate-sized confluent restricted diffusion within the posterior left temporal lobe, consistent with acute left MCA territory infarct. Patchy cortical infarct within the posterior insula. Additional scattered cortical infarcts more superiorly within the left frontoparietal region. Involvement of the sensory strip. Associated gyral swelling without significant edema. No associated hemorrhage. Major intracranial vascular flow voids maintained. No other infarct. No mass lesion, midline shift, or mass effect. No hydrocephalus. No extra-axial fluid collection. Craniocervical junction within normal limits. Pituitary gland normal. No acute abnormality about the orbits. Sequela prior bilateral lens extraction noted. Mild to moderate mucosal thickening within the left sphenoid sinus. Paranasal sinuses are otherwise clear. Trace opacity within the mastoid air cells bilaterally. Inner ear structures grossly normal. Bone marrow signal intensity within normal limits. No scalp soft tissue abnormality. MRA HEAD FINDINGS ANTERIOR CIRCULATION: Visualized distal cervical segments of the internal carotid arteries are patent with antegrade flow. Petrous, cavernous, and supraclinoid segments widely patent. A1 segments well opacified. Anterior communicating artery normal. Anterior cerebral arteries patent to their distal aspects. M1 segments patent without occlusion. Short-segment mild stenosis within the mid left M1 segment (series 304, image 13). No other focal stenosis. MCA bifurcations  normal. Distal MCA branches well opacified. No proximal M2 branch occlusion. POSTERIOR CIRCULATION: Vertebral arteries patent to the vertebrobasilar junction. Right vertebral artery is dominant. Posterior inferior cerebral arteries patent. Dominant left anterior inferior cerebral artery noted. Basilar artery well opacified. Superior cerebellar arteries patent bilaterally. Both posterior cerebral arteries arise from the basilar artery and are well opacified to their distal aspects. Single short segment mild stenosis within the  proximal right PCA (series 306, image 14). No aneurysm. IMPRESSION: MRI HEAD IMPRESSION: 1. Acute ischemic nonhemorrhagic moderate-sized left MCA territory infarct. 2. Mild chronic microvascular ischemic disease. MRA HEAD IMPRESSION: 1. No large or proximal arterial branch occlusion. 2. Single subtle short-segment mild stenosis within the mid left M1 segment, with additional focal mild stenosis within the proximal right PCA. Otherwise normal intracranial MRA. Electronically Signed   By: Rise Mu M.D.   On: 03/04/2015 00:06   Mr Maxine Glenn Head/brain Wo Cm  03/04/2015  CLINICAL DATA:  Initial evaluation for acute speech changes. EXAM: MRI HEAD WITHOUT CONTRAST MRA HEAD WITHOUT CONTRAST TECHNIQUE: Multiplanar, multiecho pulse sequences of the brain and surrounding structures were obtained without intravenous contrast. Angiographic images of the head were obtained using MRA technique without contrast. COMPARISON:  Prior CT from 03/01/2014 FINDINGS: MRI HEAD FINDINGS Diffuse prominence of the CSF containing spaces compatible with generalized cerebral atrophy, within normal limits for patient age. Patchy T2/FLAIR hyperintensity within the periventricular and deep white matter both cerebral hemispheres most consistent with chronic small vessel ischemic disease, mild in nature. Small vessel type changes present within the pons as well. Small remote lacunar infarcts within the bilateral  thalami. There is moderate-sized confluent restricted diffusion within the posterior left temporal lobe, consistent with acute left MCA territory infarct. Patchy cortical infarct within the posterior insula. Additional scattered cortical infarcts more superiorly within the left frontoparietal region. Involvement of the sensory strip. Associated gyral swelling without significant edema. No associated hemorrhage. Major intracranial vascular flow voids maintained. No other infarct. No mass lesion, midline shift, or mass effect. No hydrocephalus. No extra-axial fluid collection. Craniocervical junction within normal limits. Pituitary gland normal. No acute abnormality about the orbits. Sequela prior bilateral lens extraction noted. Mild to moderate mucosal thickening within the left sphenoid sinus. Paranasal sinuses are otherwise clear. Trace opacity within the mastoid air cells bilaterally. Inner ear structures grossly normal. Bone marrow signal intensity within normal limits. No scalp soft tissue abnormality. MRA HEAD FINDINGS ANTERIOR CIRCULATION: Visualized distal cervical segments of the internal carotid arteries are patent with antegrade flow. Petrous, cavernous, and supraclinoid segments widely patent. A1 segments well opacified. Anterior communicating artery normal. Anterior cerebral arteries patent to their distal aspects. M1 segments patent without occlusion. Short-segment mild stenosis within the mid left M1 segment (series 304, image 13). No other focal stenosis. MCA bifurcations normal. Distal MCA branches well opacified. No proximal M2 branch occlusion. POSTERIOR CIRCULATION: Vertebral arteries patent to the vertebrobasilar junction. Right vertebral artery is dominant. Posterior inferior cerebral arteries patent. Dominant left anterior inferior cerebral artery noted. Basilar artery well opacified. Superior cerebellar arteries patent bilaterally. Both posterior cerebral arteries arise from the basilar  artery and are well opacified to their distal aspects. Single short segment mild stenosis within the proximal right PCA (series 306, image 14). No aneurysm. IMPRESSION: MRI HEAD IMPRESSION: 1. Acute ischemic nonhemorrhagic moderate-sized left MCA territory infarct. 2. Mild chronic microvascular ischemic disease. MRA HEAD IMPRESSION: 1. No large or proximal arterial branch occlusion. 2. Single subtle short-segment mild stenosis within the mid left M1 segment, with additional focal mild stenosis within the proximal right PCA. Otherwise normal intracranial MRA. Electronically Signed   By: Rise Mu M.D.   On: 03/04/2015 00:06    Micro Results    No results found for this or any previous visit (from the past 240 hour(s)).   Today   Subjective    Mayreli Alden today has no headache,no chest abdominal pain,no new weakness tingling or  numbness, feels much better wants to go home today.    Objective   Blood pressure 108/53, pulse 76, temperature 97.7 F (36.5 C), temperature source Oral, resp. rate 18, height 5\' 7"  (1.702 m), weight 60.147 kg (132 lb 9.6 oz), SpO2 100 %.  No intake or output data in the 24 hours ending 03/08/15 1140  Exam Awake Alert, Oriented x 3, No new F.N deficits, Normal affect [place].AT,PERRAL Supple Neck,No JVD, No cervical lymphadenopathy appriciated.  Symmetrical Chest wall movement, Good air movement bilaterally, CTAB RRR,No Gallops,Rubs or new Murmurs, No Parasternal Heave +ve B.Sounds, Abd Soft, Non tender, No organomegaly appriciated, No rebound -guarding or rigidity. No Cyanosis, Clubbing or edema, No new Rash or bruise   Data Review   CBC w Diff:  Lab Results  Component Value Date   WBC 6.9 03/02/2015   HGB 13.4 03/02/2015   HCT 40.0 03/02/2015   PLT 228 03/02/2015   LYMPHOPCT 25 03/02/2015   MONOPCT 7 03/02/2015   EOSPCT 1 03/02/2015   BASOPCT 1 03/02/2015    CMP:  Lab Results  Component Value Date   NA 141 03/02/2015   K 4.0  03/02/2015   CL 106 03/02/2015   CO2 26 03/02/2015   BUN 14 03/02/2015   CREATININE 0.67 03/02/2015   PROT 6.9 03/02/2015   ALBUMIN 4.0 03/02/2015   BILITOT 1.2 03/02/2015   ALKPHOS 69 03/02/2015   AST 18 03/02/2015   ALT 9* 03/02/2015  . Lab Results  Component Value Date   HGBA1C 5.6 03/03/2015   Lab Results  Component Value Date   CHOL 158 03/03/2015   HDL 51 03/03/2015   LDLCALC 96 03/03/2015   TRIG 57 03/03/2015   CHOLHDL 3.1 03/03/2015     Total Time in preparing paper work, data evaluation and todays exam - 35 minutes  Leroy SeaSINGH,Deicy Rusk K M.D on 03/08/2015 at 11:40 AM  [facility]   Office  [phone]

## 2015-03-07 NOTE — Progress Notes (Signed)
  Echocardiogram Echocardiogram Transesophageal has been performed.  Allison Marshall, Allison Marshall R 03/07/2015, 12:29 PM

## 2015-03-07 NOTE — Progress Notes (Signed)
Physical Therapy Treatment Patient Details Name: Allison BottomsBertha L Marshall MRN: [id number] DOB: 09/27/1926 Today's Date: 03/07/2015    History of Present Illness Pt is an 82 y/o female who presents with difficulty speaking. MRI revealed moderate sized L MCA stroke.     PT Comments    Daughter present and observed pt's mobility. She and patient feel she is not far from baseline re: mobility. Still having expressive/word finding issues, however intact receptively. Cognitively much improved. Lengthy discussion re: d/c options and in agreement that patient will go home with [facility] therapies.   Follow Up Recommendations  Home health PT;Supervision/Assistance - 24 hour (daughter can provide)     Equipment Recommendations  None recommended by PT    Recommendations for Other Services       Precautions / Restrictions Precautions Precautions: Fall Restrictions Weight Bearing Restrictions: No    Mobility  Bed Mobility Overal bed mobility: Modified Independent Bed Mobility: Supine to Sit     Supine to sit: Mod assist;Modified independent (Device/Increase time)        Transfers Overall transfer level: Needs assistance Equipment used: None Transfers: Sit to/from Stand Sit to Stand: Min guard         General transfer comment: no physical assist; for safety  Ambulation/Gait Ambulation/Gait assistance: Min assist Ambulation Distance (Feet): 200 Feet Assistive device: 1 person hand held assist Gait Pattern/deviations: Step-through pattern;Decreased stride length;Shuffle Gait velocity: Decreased Gait velocity interpretation: Below normal speed for age/gender General Gait Details: HHA to simulate cane. 1 small stagger step with independent recovery. States almost her normal walking   Information systems managertairs            Wheelchair Mobility    Modified Rankin (Stroke Patients Only) Modified Rankin (Stroke Patients Only) Pre-Morbid Rankin Score: Slight disability Modified Rankin: Moderately  severe disability     Balance                                    Cognition Arousal/Alertness: Awake/alert Behavior During Therapy: WFL for tasks assessed/performed Overall Cognitive Status: Within Functional Limits for tasks assessed                      Exercises      General Comments        Pertinent Vitals/Pain Pain Assessment: No/denies pain    Home Living                      Prior Function            PT Goals (current goals can now be found in the care plan section) Acute Rehab PT Goals Time For Goal Achievement: 03/18/15 Progress towards PT goals: Progressing toward goals    Frequency  Min 4X/week    PT Plan Current plan remains appropriate    Co-evaluation             End of Session Equipment Utilized During Treatment: Gait belt Activity Tolerance: Patient tolerated treatment well Patient left: in chair;with call bell/phone within reach;with chair alarm set;with family/visitor present     Time: [id number] PT Time Calculation (min) (ACUTE ONLY): 31 min  Charges:  $Gait Training: 23-37 mins                    G Codes:      Allison Marshall 03/07/2015, 4:32 PM Pager [phone]

## 2015-03-08 ENCOUNTER — Encounter (HOSPITAL_COMMUNITY): Payer: Self-pay | Admitting: Cardiovascular Disease

## 2015-03-08 ENCOUNTER — Ambulatory Visit (HOSPITAL_COMMUNITY): Payer: Medicare Other

## 2015-03-08 DIAGNOSIS — I639 Cerebral infarction, unspecified: Secondary | ICD-10-CM

## 2015-03-08 NOTE — Progress Notes (Signed)
Occupational Therapy Treatment Patient Details Name: Allison Marshall MRN: [id number] DOB: [birth year] Today's Date: 03/08/2015    History of present illness Pt is an 82 y/o female who presents with difficulty speaking. MRI revealed moderate sized L MCA stroke.    OT comments  Pt making good progress towards OT goals. Pt with improved cognition today and appeared less confused and no evidence of perseveration or apraxia. Pt required supervision for all mobility and ADLs which daughter can provide. Recommending HHOT for ADL retraining and fall risk evaluation.   Follow Up Recommendations  Home health OT;Supervision/Assistance - 24 hour    Equipment Recommendations  None recommended by OT (confirmed DME pt has with daughter)    Recommendations for Other Services      Precautions / Restrictions Precautions Precautions: Fall Restrictions Weight Bearing Restrictions: No       Mobility Bed Mobility               General bed mobility comments: Pt up in chair on OT arrival  Transfers Overall transfer level: Needs assistance Equipment used: Rolling walker (2 wheeled) Transfers: Sit to/from Stand Sit to Stand: Supervision         General transfer comment: No physical assist required - advised pt to use RW at home for first few days for safety.    Balance Overall balance assessment: Needs assistance Sitting-balance support: No upper extremity supported;Feet supported Sitting balance-Leahy Scale: Good     Standing balance support: Bilateral upper extremity supported;During functional activity Standing balance-Leahy Scale: Good Standing balance comment: Good weightshifiting with bilateral extremity support during tub transfers                   ADL Overall ADL's : Needs assistance/impaired     Grooming: Wash/dry hands;Supervision/safety;Standing           Upper Body Dressing : Supervision/safety;Standing   Lower Body Dressing: Supervision/safety;Sit  to/from stand   Toilet Transfer: Supervision/safety;Ambulation;BSC;RW   Toileting- Clothing Manipulation and Hygiene: Supervision/safety;Sit to/from stand   Tub/ Shower Transfer: Tub transfer;Min guard;Ambulation;Cueing for safety;Shower seat;Rolling walker;Grab bars   Functional mobility during ADLs: Min guard;Rolling walker General ADL Comments: Pt appears to be functioning at baseline according to pt and daughter. Pt with improved cognition today with no evidence of perseveration, apraxia, and pt appears less confused. Completed tub transfer and advised pt/daughter to bring RW into bathroom to assist with getting in/out of tub safely and to have supervision for the first few times pt attempts to get into tub.      Vision                     Perception     Praxis      Cognition   Behavior During Therapy: WFL for tasks assessed/performed Overall Cognitive Status: Within Functional Limits for tasks assessed                       Extremity/Trunk Assessment               Exercises     Shoulder Instructions       General Comments      Pertinent Vitals/ Pain       Pain Assessment: No/denies pain  Home Living  Prior Functioning/Environment              Frequency Min 2X/week     Progress Toward Goals  OT Goals(current goals can now be found in the care plan section)  Progress towards OT goals: Progressing toward goals  Acute Rehab OT Goals Patient Stated Goal: to be independent  OT Goal Formulation: With patient Time For Goal Achievement: 03/18/15 Potential to Achieve Goals: Good ADL Goals Pt Will Perform Grooming: with modified independence;standing Pt Will Perform Upper Body Dressing: with modified independence;sitting Pt Will Perform Lower Body Dressing: with modified independence;sit to/from stand Pt Will Transfer to Toilet: with modified independence;ambulating;regular  height toilet Pt Will Perform Toileting - Clothing Manipulation and hygiene: with modified independence;sitting/lateral leans Pt Will Perform Tub/Shower Transfer: Tub transfer;with supervision;ambulating;grab bars;rolling walker  Plan Discharge plan needs to be updated    Co-evaluation                 End of Session Equipment Utilized During Treatment: Gait belt;Rolling walker   Activity Tolerance Patient tolerated treatment well   Patient Left in chair;with call bell/phone within reach;with family/visitor present   Nurse Communication Mobility status        Time: 5621-3086 OT Time Calculation (min): 27 min  Charges: OT General Charges $OT Visit: 1 Procedure OT Treatments $Self Care/Home Management : 23-37 mins  Nils Pyle, OTR/L Pager: [phone] 03/08/2015, 12:32 PM

## 2015-03-08 NOTE — Progress Notes (Signed)
Patient is discharged from room 5M16 at this time. Alert and in stable condition. IV site d/c'd as well as tele. Instructions read to patient and understanding verbalized. EMMI consented to. Left unit via wheelchair with family and all belongings at side.

## 2015-03-08 NOTE — Progress Notes (Signed)
VASCULAR LAB PRELIMINARY  PRELIMINARY  PRELIMINARY  PRELIMINARY     Bilateral Lower extremity venous Doppler has been completed. Bilateral:  No evidence of DVT, superficial thrombosis, or Baker's Cyst.   Jenetta Loges, RVT, RDMS 03/08/2015, 11:00 AM

## 2015-03-08 NOTE — Progress Notes (Signed)
SLP Cancellation Note  Patient Details Name: Allison Marshall MRN: [id number] DOB: [birth year]   Cancelled treatment:       Reason Eval/Treat Not Completed: Patient at procedure or test/unavailable.  SLP will follow up as able.  Fae Pippin, M.A., CCC-SLP [phone]  Sheleen Conchas 03/08/2015, 10:31 AM

## 2015-03-08 NOTE — Progress Notes (Signed)
STROKE TEAM PROGRESS NOTE    SUBJECTIVE (INTERVAL HISTORY)  Patient up in chair, talkative. When told about PFO, "well, I have to have something to talk about". Thankful for her care.   OBJECTIVE Temp:  [97.7 F (36.5 C)-98.1 F (36.7 C)] 97.7 F (36.5 C) (01/17 0954) Pulse Rate:  [58-263] 76 (01/17 0954) Cardiac Rhythm:  [-] Heart block (01/17 0700) Resp:  [11-22] 18 (01/17 0954) BP: (87-141)/(37-93) 108/53 mmHg (01/17 0954) SpO2:  [67 %-100 %] 100 % (01/17 0954)  CBC: ke  Recent Labs Lab 03/02/15 2135  WBC 6.9  NEUTROABS 4.6  HGB 13.4  HCT 40.0  MCV 95.5  PLT 228    Basic Metabolic Panel:   Recent Labs Lab 03/02/15 2135  NA 141  K 4.0  CL 106  CO2 26  GLUCOSE 89  BUN 14  CREATININE 0.67  CALCIUM 9.2    Lipid Panel:     Component Value Date/Time   CHOL 158 03/03/2015 0512   TRIG 57 03/03/2015 0512   HDL 51 03/03/2015 0512   CHOLHDL 3.1 03/03/2015 0512   VLDL 11 03/03/2015 0512   LDLCALC 96 03/03/2015 0512   HgbA1c:  Lab Results  Component Value Date   HGBA1C 5.6 03/03/2015   Urine Drug Screen: No results found for: LABOPIA, COCAINSCRNUR, LABBENZ, AMPHETMU, THCU, LABBARB    IMAGING  Dg Chest 2 View 03/02/2015    Cardiomegaly.  Hiatal hernia.   Ct Head Wo Contrast 03/02/2015    Focal left posterior temporal lobe hypodensity compatible with an infarct, likely acute or subacute. Clinical correlation is recommended. CT angiography or MRI is recommended for better evaluation. No acute intracranial hemorrhage.   MRI HEAD  03/04/2015    1. Acute ischemic nonhemorrhagic moderate-sized left MCA territory infarct.  2. Mild chronic microvascular ischemic disease.   MRA HEAD  03/04/2015    1. No large or proximal arterial branch occlusion.  2. Single subtle short-segment mild stenosis within the mid left M1 segment, with additional focal mild stenosis within the proximal right PCA. Otherwise normal intracranial MRA.   Carotid Doppler   There is  1-39% bilateral ICA stenosis. Vertebral artery flow is antegrade.    2D Echocardiogram  - Left ventricle: The cavity size was normal. Systolic function wasnormal. The estimated ejection fraction was in the range of 60%to 65%. Wall motion was normal; there were no regional wallmotion abnormalities. Doppler parameters are consistent withabnormal left ventricular relaxation (grade 1 diastolicdysfunction). - Aortic valve: There is an abnormal partially mobile lineardensity on the aortic side of the aortic valve. It may be an offaxis view of the other leaflet, but TEE is recommended to excludetumor or other mass. There was trivial regurgitation. - Mitral valve: Mildly calcified annulus. There was mildregurgitation. - Left atrium: The atrium was moderately dilated. - Tricuspid valve: There was mild-moderate regurgitation. - Pulmonary arteries: Systolic pressure was moderately increased.PA peak pressure: 48 mm Hg (S).  TEE  Normal EF 69% Mild MR Mild AR Small lambl's excresence on right coronary cusp not likely a source of embolus  Normal RV No LAA thrombus Large PFO with positive bubble study and right to left shunt. Not a candidate for closure given age No effusion Moderate mural aortic debris  LE VENOUS DOPPLER  Bilateral: No evidence of DVT, superficial thrombosis, or Baker's Cyst.   PHYSICAL EXAM [name] sitting in a bedside chair. . Afebrile. Head is nontraumatic. Neck is supple without bruit.    Cardiac exam no murmur  or gallop. Lungs are clear to auscultation. Distal pulses are well felt. Neurological Exam :  Awake alert oriented 3. Diminished attension and restoration and recall. Speech and language appear normal. No aphasia or apraxia dysarthria. Able to name repeat and comprehend quite well. Extraocular movements are full range without nystagmus. Motor system exam reveals symmetric upper and lower extremity strength. No focal weakness. Mild  intermittent resting tremor right upper molar left upper extremity and tremor also involves the jaw and lower lip. Mild cogwheel rigidity at the right wrist. Deep tendon reflexes are 1+ symmetric. Plantars downgoing. Coordination slow but accurate. Gait was not tested.    ASSESSMENT/PLAN [name] is a 82 y.o. female with history of Parkinson's disease and peptic ulcer disease with upper GI hemorrhage presenting to [facility] with speech output difficulty, transferred to [facility] for stroke workup. She did not receive IV t-PA due to delay in arrival.   Stroke:  left MCA infarct embolic secondary to unknown source  Resultant  Speech difficulty (dysarthria) resolving  MRI  L MCA infarct  MRA  No large vessel occlusion, focal stenosis L M1 and prox R PCA  Carotid Doppler  No significant stenosis   2D Echo  abnormal partially mobile lineardensity on the aortic side of the aortic valve  TEE large PFO. PFO (patent foramen ovale) likely an incidental finding.   LE venous doppler negative   LDL 96  HgbA1c 5.6  Lovenox 40 mg sq daily for VTE prophylaxis Diet - low sodium heart healthy Diet Heart Room service appropriate?: Yes; Fluid consistency:: Thin  No antithrombotic prior to admission, now on aspirin 325 mg daily. Recommend change to plavix alone  No further embolic workup as she is not a good long-term anticoagulation candidate  Ongoing aggressive stroke risk factor management  Therapy recommendations:  HH PT, OT recommends SNF  Disposition:  pending   Welcome to see Dr. Pearlean Brownie for followup of Parkkinson's as well as stroke. Order written.  Hypertension  Stable  Hyperlipidemia  Home meds:  No statin  LDL 96, goal < 70  Added pravachol 20  Continue statin at discharge  Other Stroke Risk Factors  Advanced age  Other Active Problems  Parkinson's Disease  GERD w/ Hx GIB secondary to PUD  NOTHING FURTHER TO ADD FROM THE STROKE STANDPOINT  Patient has a  10-15% risk of having another stroke over the next year, the highest risk is within 2 weeks of the most recent stroke/TIA (risk of having a stroke following a stroke or TIA is the same).  Ongoing risk factor control by Primary Care Physician  Stroke Service will sign off. Please call should any needs arise.  Follow-up Stroke Clinic at [facility] with Dr. Delia Heady in 2 months, order placed.  Hospital day # 6  Rhoderick Moody [facility] See Amion for Pager information 03/08/2015 11:39 AM  I have personally examined this patient, reviewed notes, independently viewed imaging studies, participated in medical decision making and plan of care. I have made any additions or clarifications directly to the above note. Agree with note above.    Delia Heady, MD Medical Director [facility] Pager: [phone] 03/08/2015 4:24 PM   To contact Stroke Continuity provider, please refer to [email or web address]. After hours, contact General Neurology

## 2015-03-11 ENCOUNTER — Other Ambulatory Visit: Payer: Self-pay

## 2015-03-11 NOTE — Patient Outreach (Signed)
[facility]) Care Management  03/11/2015  Allison Marshall [birth year] [id number]  Telephone call to patient regarding EMMI stroke RED referral. Unable to reach patient. HIPAA compliant voice message left with call back phone number.   PLAN; RNCM will attempt 2nd telephone outreach to patient within 1 week.   George Ina RN,BSN,CCM [facility] Telephonic  [phone]

## 2015-03-14 ENCOUNTER — Other Ambulatory Visit: Payer: Self-pay

## 2015-03-14 NOTE — Patient Outreach (Addendum)
[facility]) Care Management  03/14/2015  Allison Marshall [birth year] [id number]   SUBJECTIVE; Telephone call to patient regarding EMMI stroke RED referral. HIPAA verified with patients daughter Allison Marshall for patient. Patients daughter listed as patients representative.  Daughter states patient is doing very well. States the only deficit patient has from the stroke is some difficulty with her speech at time.  Daughter states patients therapy with [facility] started on yesterday 03/13/15. Daughter reports patient was evaluated for physical therapy but it was not needed. Daughter states patient will be having speech therapy.  Daughter states patient is able to afford her medications and is taking them as prescribed. Daughter states she provides patients transportation to her doctor appointments. Daughter states patient has a follow up visit scheduled with her primary  MD on 03/16/15. Daughter states she has not scheduled a follow up appointment with Dr. Pearlean Brownie at this time. States she will schedule for patient.  Daughter states she received a call from Dr. Jodelle Red office to schedule patient for follow up appointment. Daughter states she did not see this listed on patients discharge instructions from the hospital. Daughter states she will discuss this appointment  With patients primary MD on tomorrow. Daughter denies patient having any needs at this time. Daughter states patient received educational material on stroke from the hospital nurse. RNCM reviewed signs and symptoms of stroke with patients daughter, Allison Marshall. Advised to call primary MD.  RNCM advised daughter that patient should take her medication as prescribed, keep scheduled doctor appointments,  ASSESSMENT:  EMMI stroke transition program.   PLAN:  RNCM will follow up with patient and/or daughter within 1 week.  George Ina RN,BSN,CCM [facility] Telephonic  [phone]

## 2015-03-17 ENCOUNTER — Encounter: Payer: Medicare Other | Admitting: Physician Assistant

## 2015-03-29 ENCOUNTER — Other Ambulatory Visit: Payer: Self-pay

## 2015-03-29 NOTE — Patient Outreach (Signed)
[facility]) Care Management  [facility] Care Manager  03/29/2015   Allison Marshall [birth year] [id number]  Subjective: Telephone call to patient/daughter regarding EMMI stroke follow up.  HIPAA verified with patient. Patient states she is doing well.  Patient states she thinks her home health therapy has ended. Patient states she has a follow up appointment to see her primary MD within 1 week. Patient denies any unusual signs/symptoms. Patient states she has support from her daughters Gigi Gin and Almyra Free. Patient states her daughter Almyra Free lives next door.  RNCM advised patient to continue to take her medications as advised. RNCM advised patient to keep follow up appointments with her doctor. RNCM advised patient to report any unusual symptoms to her doctor.  RNCM reviewed symptoms of stroke with patient.  Advised patient to contact 911 for stroke like symptoms.  Patient verbalized understanding. Patient verbalized agreement to next telephone outreach with [facility].   Objective: see assessment  Current Medications:  Current Outpatient Prescriptions  Medication Sig Dispense Refill  . acetaminophen (TYLENOL) 325 MG tablet Take 325-650 mg by mouth every 6 (six) hours as needed for moderate pain.    Marland Kitchen aspirin 325 MG tablet Take 1 tablet (325 mg total) by mouth daily. 30 tablet 0  . carbidopa-levodopa (SINEMET IR) 25-100 MG tablet Take 1 tablet by mouth daily.    . Cholecalciferol (VITAMIN D PO) Take 1 tablet by mouth daily.    . pantoprazole (PROTONIX) 40 MG tablet Take 1 tablet (40 mg total) by mouth daily. 30 tablet 0  . pravastatin (PRAVACHOL) 20 MG tablet Take 1 tablet (20 mg total) by mouth daily at 6 PM. 30 tablet 0   No current facility-administered medications for this visit.    Functional Status:  In your present state of health, do you have any difficulty performing the following activities: 03/29/2015 03/03/2015  Hearing? N -  Vision? Y -  Difficulty concentrating or making  decisions? N -  Walking or climbing stairs? Y -  Dressing or bathing? N -  Doing errands, shopping? Malvin Johns  Preparing Food and eating ? N -  Using the Toilet? N -  In the past six months, have you accidently leaked urine? N -  Do you have problems with loss of bowel control? N -  Managing your Medications? N -  Managing your Finances? Y -  Housekeeping or managing your Housekeeping? Y -    Fall/Depression Screening: PHQ 2/9 Scores 03/29/2015  PHQ - 2 Score 0    Fall Risk  03/29/2015 03/14/2015  Falls in the past year? No No    Assessment: EMMI stroke transition program  Plan: RNCM will follow up with patient's daughter, [name] within 1 week. RNCM will follow up with patient and/or daughter within 1 week.   George Ina RN,BSN,CCM [facility] Telephonic  [phone]

## 2015-03-31 ENCOUNTER — Ambulatory Visit: Payer: Self-pay

## 2015-04-01 ENCOUNTER — Other Ambulatory Visit: Payer: Self-pay

## 2015-04-01 NOTE — Patient Outreach (Signed)
[facility]) Care Management  04/01/2015  FREDRICK DRAY Jul 29, 1926 [id number]   SUBJECTIVE;  RNCM follow up call to patients daughter, [name].  Verbal authorization was given by patient on 03/29/15 to speak with her daughter regarding all/ any of her medical information. Daughter confirms that patient's  Home speech therapy ended on Tuesday, March 29, 2015. Daughter states patient had follow up visit with her primary MD.  Daughter states she was present at visit. Daughter denies any changes with patients treatment plan at primary MD visit. Daughter states patient has been scheduled for follow up appointment with Dr. Pearlean Brownie in April 2017. Daughter reports there has been no changes in patients status and no complaint of symptoms.  RNCM reviewed signs and symptoms of stroke with patients daughter.  Advised to call 911 for patient for stroke like symptoms. Verbalized understanding.   ASSESSMENT: EMMI stroke transition program.   PLAN; RNCM will follow up with patient within 1 week at next scheduled outreach.   George Ina RN,BSN,CCM [facility] Telephonic  [phone]

## 2015-04-08 ENCOUNTER — Other Ambulatory Visit: Payer: Self-pay

## 2015-04-08 NOTE — Patient Outreach (Signed)
[facility]) Care Management  04/08/2015  Allison Marshall Aug 08, 1926 [id number]  SUBJECTIVE: Telephone call to patient regarding EMMI stroke program follow up.  HIPAA verified with patient.  Patient states she is doing very well.  Patient states she does not have any complaints.  Patient states no changes to her medications or care since last outreach from [facility]. Patient states her daughter, [name] is not at her house at the moment. RNCM reviewed signs and symptoms of stroke with patient.  Advised to call 911 for stroke like symptoms.  RNCM advised patient to continue to take her medications as prescribed. Patient states both of her daughter check up on her frequently.   ASSESSMENT:  EMMI stroke transition program.   PLAN:  RNCM will contact patient and/or daughter [name] within 1 week.   George Ina RN,BSN,CCM [facility] Telephonic  [phone]

## 2015-04-15 ENCOUNTER — Other Ambulatory Visit: Payer: Self-pay

## 2015-04-15 NOTE — Patient Outreach (Signed)
[facility]) Care Management  04/15/2015  CARMELINE KOWAL 1926-07-07 [id number]   Telephone call to patient and patients daughter, [name]. Unable to reach patient or daughter.  HIPAA compliant voice message left with call back phone number.   PLAN:  RNCM will attempt 2nd telephone call to patient within  1 week.   George Ina RN,BSN,CCM [facility] Telephonic  [phone]

## 2015-04-18 ENCOUNTER — Other Ambulatory Visit: Payer: Self-pay

## 2015-04-18 NOTE — Patient Outreach (Signed)
[facility]) Care Management  04/18/2015  RICKETTA COLANTONIO October 30, 1926 [id number]   Second telephone call to patient and daughter for EMMI stroke follow up. Unable to reach patient or daughter. HIPAA compliant voice message left with call back phone number.   PLAN:  RNCM will attempt 3rd telephone outreach to patient within 1 week.   George Ina RN,BSN,CCM [facility] Telephonic  [phone]

## 2015-04-19 ENCOUNTER — Other Ambulatory Visit: Payer: Self-pay

## 2015-04-19 NOTE — Patient Outreach (Signed)
[facility]) Care Management  04/19/2015  RISSA TURLEY [birth year] [id number]   SUBJECTIVE:  Telephone call to patient regarding EMMI stroke follow up.  Patient states she is doing well. Patient reports she follow up with her doctor on last week. States appointment went well and no changes were made to her medications. Patient denies any new onset of symptoms and denies any falls.  Patient verbalized understanding and agreement with [facility] care management closing her to services at this time due to no further nursing needs.  RNCM attempt telephone outreach #3 to patients daughter, Elaina Hoops. Unable to reach. HIPAA compliant voice messagae left with call back phone number.   PLAN: RNCM will refer patient to Josepha Pigg to close due to patient goals being met. No further nursing needs identified at this time.    Quinn Plowman RN,BSN,CCM [facility] Telephonic  [phone]

## 2015-06-01 ENCOUNTER — Ambulatory Visit: Payer: Medicare Other | Admitting: Neurology

## 2017-03-06 ENCOUNTER — Emergency Department (HOSPITAL_COMMUNITY)
Admission: EM | Admit: 2017-03-06 | Discharge: 2017-03-07 | Disposition: A | Discharge status: Home / Self Care | Payer: Medicare Other | Attending: Emergency Medicine | Admitting: Emergency Medicine

## 2017-03-06 ENCOUNTER — Encounter (HOSPITAL_COMMUNITY): Payer: Self-pay | Admitting: Emergency Medicine

## 2017-03-06 DIAGNOSIS — Z8673 Personal history of transient ischemic attack (TIA), and cerebral infarction without residual deficits: Secondary | ICD-10-CM | POA: Insufficient documentation

## 2017-03-06 DIAGNOSIS — G20 Parkinson's disease: Secondary | ICD-10-CM | POA: Diagnosis not present

## 2017-03-06 DIAGNOSIS — I872 Venous insufficiency (chronic) (peripheral): Secondary | ICD-10-CM | POA: Insufficient documentation

## 2017-03-06 DIAGNOSIS — R2243 Localized swelling, mass and lump, lower limb, bilateral: Secondary | ICD-10-CM | POA: Diagnosis present

## 2017-03-06 DIAGNOSIS — Z79899 Other long term (current) drug therapy: Secondary | ICD-10-CM | POA: Diagnosis not present

## 2017-03-06 DIAGNOSIS — Z7982 Long term (current) use of aspirin: Secondary | ICD-10-CM | POA: Insufficient documentation

## 2017-03-06 DIAGNOSIS — I878 Other specified disorders of veins: Secondary | ICD-10-CM

## 2017-03-06 LAB — CBC WITH DIFFERENTIAL/PLATELET
Basophils Absolute: 0.1 10*3/uL (ref 0.0–0.1)
Basophils Relative: 1 %
EOS ABS: 0 10*3/uL (ref 0.0–0.7)
EOS PCT: 1 %
HCT: 40.4 % (ref 36.0–46.0)
Hemoglobin: 13.6 g/dL (ref 12.0–15.0)
Lymphocytes Relative: 13 %
Lymphs Abs: 1 10*3/uL (ref 0.7–4.0)
MCH: 32.1 pg (ref 26.0–34.0)
MCHC: 33.7 g/dL (ref 30.0–36.0)
MCV: 95.3 fL (ref 78.0–100.0)
MONO ABS: 0.7 10*3/uL (ref 0.1–1.0)
MONOS PCT: 10 %
Neutro Abs: 5.7 10*3/uL (ref 1.7–7.7)
Neutrophils Relative %: 75 %
PLATELETS: 221 10*3/uL (ref 150–400)
RBC: 4.24 MIL/uL (ref 3.87–5.11)
RDW: 12.2 % (ref 11.5–15.5)
WBC: 7.4 10*3/uL (ref 4.0–10.5)

## 2017-03-06 LAB — COMPREHENSIVE METABOLIC PANEL
ALT: 25 U/L (ref 14–54)
ANION GAP: 12 (ref 5–15)
AST: 33 U/L (ref 15–41)
Albumin: 3.8 g/dL (ref 3.5–5.0)
Alkaline Phosphatase: 74 U/L (ref 38–126)
BILIRUBIN TOTAL: 1 mg/dL (ref 0.3–1.2)
BUN: 22 mg/dL — ABNORMAL HIGH (ref 6–20)
CHLORIDE: 105 mmol/L (ref 101–111)
CO2: 23 mmol/L (ref 22–32)
Calcium: 9.1 mg/dL (ref 8.9–10.3)
Creatinine, Ser: 0.8 mg/dL (ref 0.44–1.00)
GFR calc Af Amer: >60 mL/min (ref >60)
GFR calc non Af Amer: >60 mL/min (ref >60)
GLUCOSE: 103 mg/dL — AB (ref 65–99)
POTASSIUM: 3.9 mmol/L (ref 3.5–5.1)
SODIUM: 140 mmol/L (ref 135–145)
TOTAL PROTEIN: 6.3 g/dL — AB (ref 6.5–8.1)

## 2017-03-06 NOTE — ED Triage Notes (Signed)
Patient reports worsening bilateral lower legs swelling/redness onset last week , denies injury /no fever or chills .

## 2017-03-07 MED ORDER — DOXYCYCLINE HYCLATE 100 MG PO CAPS: 100.0000 mg | ORAL_CAPSULE | Freq: Two times a day (BID) | ORAL | 0 refills | Status: DC

## 2017-03-07 MED ORDER — FUROSEMIDE 20 MG PO TABS: 20.0000 mg | ORAL_TABLET | Freq: Every day | ORAL | 0 refills | Status: DC

## 2017-03-07 NOTE — Discharge Instructions (Signed)
Please take medications as directed.  Please get and use compression stockings.  The pharmacist can help you purchase the correct size.

## 2017-03-07 NOTE — ED Provider Notes (Signed)
[facility] Provider Note   CSN: [id number] Arrival date & time: 03/06/17  1708     History   Chief Complaint Chief Complaint  Patient presents with  . Cellulitis    HPI Allison Marshall is a 84 y.o. female.  Patient presents to the ED with a chief complaint of legs swelling x a few days.  She states that she has this problem intermittently.  She has a hx of venous stasis.  She denies any fever, chills, nausea, or vomiting.  She did have some diarrhea earlier in the week, but this is reportedly improving.  She denies CP or SOB.  She does not taken anything for her symptoms.   The history is provided by the patient. No language interpreter was used.    Past Medical History:  Diagnosis Date  . GIB (gastrointestinal bleeding)   . Parkinson disease (HCC)   . PUD (peptic ulcer disease)     Patient Active Problem List   Diagnosis Date Noted  . Difficulty speaking 03/02/2015  . PUD (peptic ulcer disease)   . Cerebral infarction due to unspecified mechanism   . Parkinson disease [facility](HCC)     Past Surgical History:  Procedure Laterality Date  . ESOPHAGOGASTRODUODENOSCOPY    . TEE WITHOUT CARDIOVERSION N/A 03/07/2015   Procedure: TRANSESOPHAGEAL ECHOCARDIOGRAM (TEE);  Surgeon: Wendall StadePeter C Nishan, MD;  [facility];  Service: Cardiovascular;  Laterality: N/A;    OB History    No data available       Home Medications    Prior to Admission medications   Medication Sig Start Date End Date Taking? Authorizing Provider  acetaminophen (TYLENOL) 325 MG tablet Take 325-650 mg by mouth every 6 (six) hours as needed for moderate pain.    [provider]  aspirin 325 MG tablet Take 1 tablet (325 mg total) by mouth daily. 03/07/15   Leroy SeaSingh, Prashant K, MD  carbidopa-levodopa (SINEMET IR) 25-100 MG tablet Take 1 tablet by mouth daily.    [provider]  Cholecalciferol (VITAMIN D PO) Take 1 tablet by mouth daily.     [provider]  pantoprazole (PROTONIX) 40 MG tablet Take 1 tablet (40 mg total) by mouth daily. 03/07/15   Leroy SeaSingh, Prashant K, MD  pravastatin (PRAVACHOL) 20 MG tablet Take 1 tablet (20 mg total) by mouth daily at 6 PM. 03/07/15   Leroy SeaSingh, Prashant K, MD    Family History Family History  Problem Relation Age of Onset  . Lung cancer Mother   . Diabetes Mother   . Pancreatic cancer Father     Social History Social History   Tobacco Use  . Smoking status: Never Smoker  . Smokeless tobacco: Never Used  Substance Use Topics  . Alcohol use: No  . Drug use: Not on file     Allergies   Influenza vac split [flu virus vaccine]   Review of Systems Review of Systems  All other systems reviewed and are negative.    Physical Exam Updated Vital Signs BP 131/63   Pulse 80   Temp (!) 97.5 F (36.4 C) (Oral)   Resp 18   Ht 5\' 6"  (1.676 m)   Wt 52.2 kg (115 lb)   SpO2 100%   BMI 18.56 kg/m   Physical Exam  Constitutional: She is oriented to person, place, and time. She appears well-developed and well-nourished.  HENT:  Head: Normocephalic and atraumatic.  Eyes: Conjunctivae and EOM are normal. Pupils are equal, round,  and reactive to light.  Neck: Normal range of motion. Neck supple.  Cardiovascular: Normal rate and regular rhythm. Exam reveals no gallop and no friction rub.  No murmur heard. Pulmonary/Chest: Effort normal and breath sounds normal. No respiratory distress. She has no wheezes. She has no rales. She exhibits no tenderness.  Abdominal: Soft. Bowel sounds are normal. She exhibits no distension and no mass. There is no tenderness. There is no rebound and no guarding.  Musculoskeletal: Normal range of motion. She exhibits no edema or tenderness.  Neurological: She is alert and oriented to person, place, and time.  Skin: Skin is warm and dry.  Psychiatric: She has a normal mood and affect. Her behavior is normal. Judgment and thought content normal.  Nursing  note and vitals reviewed.    ED Treatments / Results  Labs (all labs ordered are listed, but only abnormal results are displayed) Labs Reviewed  COMPREHENSIVE METABOLIC PANEL - Abnormal; Notable for the following components:      Result Value   Glucose, Bld 103 (*)    BUN 22 (*)    Total Protein 6.3 (*)    All other components within normal limits  CBC WITH DIFFERENTIAL/PLATELET    EKG  EKG Interpretation None       Radiology No results found.  Procedures Procedures (including critical care time)  Medications Ordered in ED Medications - No data to display   Initial Impression / Assessment and Plan / ED Course  I have reviewed the triage vital signs and the nursing notes.  Pertinent labs & imaging results that were available during my care of the patient were reviewed by me and considered in my medical decision making (see chart for details).     Patient with bilateral leg swelling that looks like venous stasis.  I doubt infection, but the right leg feels a little warmer than the left.  Labs and vitals are reassuring.  Patient seen by and discussed with Dr. Blinda Leatherwood, who recommends outpatient treatment with ted hose, giving a few days of lasix, and also recommends covering with doxy.  Patient and family member are agreeable with the plan.  Final Clinical Impressions(s) / ED Diagnoses   Final diagnoses:  Venous stasis    ED Discharge Orders        Ordered    doxycycline (VIBRAMYCIN) 100 MG capsule  2 times daily     03/07/17 0132    furosemide (LASIX) 20 MG tablet  Daily     03/07/17 0133       Roxy Horseman, PA-C 03/07/17 0133    Gilda Crease, MD 03/07/17 [phone]

## 2017-03-20 ENCOUNTER — Inpatient Hospital Stay (HOSPITAL_COMMUNITY)
Admission: EM | Admit: 2017-03-20 | Discharge: 2017-03-25 | DRG: 871 | Disposition: A | Discharge status: Skilled Nursing Facility | Payer: Medicare Other | Attending: Family Medicine | Admitting: Family Medicine

## 2017-03-20 ENCOUNTER — Emergency Department (HOSPITAL_COMMUNITY): Payer: Medicare Other

## 2017-03-20 ENCOUNTER — Encounter (HOSPITAL_COMMUNITY): Payer: Self-pay | Admitting: Emergency Medicine

## 2017-03-20 ENCOUNTER — Other Ambulatory Visit: Payer: Self-pay

## 2017-03-20 DIAGNOSIS — A4151 Sepsis due to Escherichia coli [E. coli]: Secondary | ICD-10-CM

## 2017-03-20 DIAGNOSIS — N3001 Acute cystitis with hematuria: Secondary | ICD-10-CM | POA: Diagnosis present

## 2017-03-20 DIAGNOSIS — Z79899 Other long term (current) drug therapy: Secondary | ICD-10-CM

## 2017-03-20 DIAGNOSIS — I63431 Cerebral infarction due to embolism of right posterior cerebral artery: Secondary | ICD-10-CM | POA: Diagnosis present

## 2017-03-20 DIAGNOSIS — N3000 Acute cystitis without hematuria: Secondary | ICD-10-CM

## 2017-03-20 DIAGNOSIS — R7881 Bacteremia: Secondary | ICD-10-CM

## 2017-03-20 DIAGNOSIS — N320 Bladder-neck obstruction: Secondary | ICD-10-CM | POA: Diagnosis present

## 2017-03-20 DIAGNOSIS — E875 Hyperkalemia: Secondary | ICD-10-CM | POA: Diagnosis present

## 2017-03-20 DIAGNOSIS — I82409 Acute embolism and thrombosis of unspecified deep veins of unspecified lower extremity: Secondary | ICD-10-CM

## 2017-03-20 DIAGNOSIS — L89152 Pressure ulcer of sacral region, stage 2: Secondary | ICD-10-CM | POA: Diagnosis not present

## 2017-03-20 DIAGNOSIS — Z887 Allergy status to serum and vaccine status: Secondary | ICD-10-CM

## 2017-03-20 DIAGNOSIS — J189 Pneumonia, unspecified organism: Secondary | ICD-10-CM | POA: Diagnosis not present

## 2017-03-20 DIAGNOSIS — E785 Hyperlipidemia, unspecified: Secondary | ICD-10-CM | POA: Diagnosis present

## 2017-03-20 DIAGNOSIS — L89159 Pressure ulcer of sacral region, unspecified stage: Secondary | ICD-10-CM | POA: Diagnosis present

## 2017-03-20 DIAGNOSIS — I10 Essential (primary) hypertension: Secondary | ICD-10-CM | POA: Diagnosis present

## 2017-03-20 DIAGNOSIS — I639 Cerebral infarction, unspecified: Secondary | ICD-10-CM | POA: Diagnosis not present

## 2017-03-20 DIAGNOSIS — R1084 Generalized abdominal pain: Secondary | ICD-10-CM

## 2017-03-20 DIAGNOSIS — G20 Parkinson's disease: Secondary | ICD-10-CM | POA: Diagnosis not present

## 2017-03-20 DIAGNOSIS — I6340 Cerebral infarction due to embolism of unspecified cerebral artery: Secondary | ICD-10-CM

## 2017-03-20 DIAGNOSIS — R29703 NIHSS score 3: Secondary | ICD-10-CM | POA: Diagnosis present

## 2017-03-20 DIAGNOSIS — Q211 Atrial septal defect: Secondary | ICD-10-CM

## 2017-03-20 DIAGNOSIS — G20A1 Parkinson's disease without dyskinesia, without mention of fluctuations: Secondary | ICD-10-CM | POA: Diagnosis present

## 2017-03-20 DIAGNOSIS — I69328 Other speech and language deficits following cerebral infarction: Secondary | ICD-10-CM

## 2017-03-20 DIAGNOSIS — Z66 Do not resuscitate: Secondary | ICD-10-CM | POA: Diagnosis present

## 2017-03-20 DIAGNOSIS — N139 Obstructive and reflux uropathy, unspecified: Secondary | ICD-10-CM | POA: Diagnosis present

## 2017-03-20 DIAGNOSIS — D72829 Elevated white blood cell count, unspecified: Secondary | ICD-10-CM | POA: Diagnosis not present

## 2017-03-20 DIAGNOSIS — Z7982 Long term (current) use of aspirin: Secondary | ICD-10-CM

## 2017-03-20 DIAGNOSIS — I82413 Acute embolism and thrombosis of femoral vein, bilateral: Secondary | ICD-10-CM | POA: Diagnosis present

## 2017-03-20 DIAGNOSIS — I63411 Cerebral infarction due to embolism of right middle cerebral artery: Secondary | ICD-10-CM | POA: Diagnosis present

## 2017-03-20 DIAGNOSIS — R5383 Other fatigue: Secondary | ICD-10-CM

## 2017-03-20 LAB — CBC WITH DIFFERENTIAL/PLATELET
BASOS ABS: 0.1 10*3/uL (ref 0.0–0.1)
Basophils Relative: 1 %
EOS ABS: 0.2 10*3/uL (ref 0.0–0.7)
Eosinophils Relative: 2 %
HEMATOCRIT: 45.2 % (ref 36.0–46.0)
HEMOGLOBIN: 15.3 g/dL — AB (ref 12.0–15.0)
LYMPHS PCT: 12 %
Lymphs Abs: 1.3 10*3/uL (ref 0.7–4.0)
MCH: 31.7 pg (ref 26.0–34.0)
MCHC: 33.8 g/dL (ref 30.0–36.0)
MCV: 93.8 fL (ref 78.0–100.0)
Monocytes Absolute: 1.2 10*3/uL — ABNORMAL HIGH (ref 0.1–1.0)
Monocytes Relative: 11 %
NEUTROS ABS: 8.3 10*3/uL — AB (ref 1.7–7.7)
NEUTROS PCT: 74 %
Platelets: 139 10*3/uL — ABNORMAL LOW (ref 150–400)
RBC: 4.82 MIL/uL (ref 3.87–5.11)
RDW: 12.5 % (ref 11.5–15.5)
WBC: 11.1 10*3/uL — ABNORMAL HIGH (ref 4.0–10.5)

## 2017-03-20 LAB — COMPREHENSIVE METABOLIC PANEL
ALBUMIN: 2.7 g/dL — AB (ref 3.5–5.0)
ALK PHOS: 64 U/L (ref 38–126)
ALT: 13 U/L — AB (ref 14–54)
AST: 52 U/L — AB (ref 15–41)
Anion gap: 13 (ref 5–15)
BILIRUBIN TOTAL: 2.2 mg/dL — AB (ref 0.3–1.2)
BUN: 33 mg/dL — AB (ref 6–20)
CALCIUM: 8.6 mg/dL — AB (ref 8.9–10.3)
CO2: 23 mmol/L (ref 22–32)
CREATININE: 0.87 mg/dL (ref 0.44–1.00)
Chloride: 98 mmol/L — ABNORMAL LOW (ref 101–111)
GFR calc Af Amer: >60 mL/min (ref >60)
GFR calc non Af Amer: 58 mL/min — ABNORMAL LOW (ref >60)
GLUCOSE: 88 mg/dL (ref 65–99)
Potassium: 6.2 mmol/L — ABNORMAL HIGH (ref 3.5–5.1)
SODIUM: 134 mmol/L — AB (ref 135–145)
TOTAL PROTEIN: 5.7 g/dL — AB (ref 6.5–8.1)

## 2017-03-20 LAB — I-STAT CHEM 8, ED
BUN: 34 mg/dL — ABNORMAL HIGH (ref 6–20)
CHLORIDE: 101 mmol/L (ref 101–111)
Calcium, Ion: 1.06 mmol/L — ABNORMAL LOW (ref 1.15–1.40)
Creatinine, Ser: 0.8 mg/dL (ref 0.44–1.00)
Glucose, Bld: 84 mg/dL (ref 65–99)
HCT: 43 % (ref 36.0–46.0)
Hemoglobin: 14.6 g/dL (ref 12.0–15.0)
POTASSIUM: 4.8 mmol/L (ref 3.5–5.1)
SODIUM: 134 mmol/L — AB (ref 135–145)
TCO2: 25 mmol/L (ref 22–32)

## 2017-03-20 LAB — URINALYSIS, ROUTINE W REFLEX MICROSCOPIC
Bilirubin Urine: NEGATIVE
GLUCOSE, UA: NEGATIVE mg/dL
Glucose, UA: NEGATIVE mg/dL
Ketones, ur: NEGATIVE mg/dL
Ketones, ur: 15 mg/dL — AB
Leukocytes, UA: NEGATIVE
NITRITE: NEGATIVE
Nitrite: POSITIVE — AB
PROTEIN: NEGATIVE mg/dL
PROTEIN: 100 mg/dL — AB
SPECIFIC GRAVITY, URINE: 1.014 (ref 1.005–1.030)
SPECIFIC GRAVITY, URINE: 1.025 (ref 1.005–1.030)
Squamous Epithelial / LPF: NONE SEEN
pH: 5 (ref 5.0–8.0)
pH: 6.5 (ref 5.0–8.0)

## 2017-03-20 LAB — URINALYSIS, MICROSCOPIC (REFLEX)

## 2017-03-20 LAB — TSH: TSH: 3.304 u[IU]/mL (ref 0.350–4.500)

## 2017-03-20 LAB — BRAIN NATRIURETIC PEPTIDE: B NATRIURETIC PEPTIDE 5: 194.9 pg/mL — AB (ref 0.0–100.0)

## 2017-03-20 LAB — I-STAT CG4 LACTIC ACID, ED
LACTIC ACID, VENOUS: 1.2 mmol/L (ref 0.5–1.9)
Lactic Acid, Venous: 1.04 mmol/L (ref 0.5–1.9)

## 2017-03-20 LAB — I-STAT TROPONIN, ED
TROPONIN I, POC: 0.26 ng/mL — AB (ref 0.00–0.08)
Troponin i, poc: 0.21 ng/mL (ref 0.00–0.08)

## 2017-03-20 LAB — PROTIME-INR
INR: 1
Prothrombin Time: 13.1 seconds (ref 11.4–15.2)

## 2017-03-20 LAB — LIPASE, BLOOD: Lipase: 24 U/L (ref 11–51)

## 2017-03-20 MED ORDER — IOPAMIDOL (ISOVUE-300) INJECTION 61%
INTRAVENOUS | Status: AC
  Administered 2017-03-20: 100 mL
  Filled 2017-03-20: qty 100

## 2017-03-20 MED ORDER — DEXTROSE 5 % IV SOLN
500.0000 mg | Freq: Once | INTRAVENOUS | Status: AC
  Administered 2017-03-21: 500 mg via INTRAVENOUS
  Filled 2017-03-20: qty 500

## 2017-03-20 MED ORDER — DEXTROSE 5 % IV SOLN
1.0000 g | Freq: Once | INTRAVENOUS | Status: AC
  Administered 2017-03-21: 1 g via INTRAVENOUS
  Filled 2017-03-20: qty 10

## 2017-03-20 NOTE — ED Notes (Signed)
Patient transported to CT 

## 2017-03-20 NOTE — ED Triage Notes (Signed)
Per EMS: Pt from home with family. Family states pt has a mass on lower abdomen and has not been feeling well since last Thursday.  Family also states pt has recently been treated for a "possible UTI" with antibiotics.  PTA vitals: BP 124/68, HR 70, RR 16, Sp02 96 RA, CBG 111.  Pt has a hx of stroke (2017) that left her with a speech deficit.

## 2017-03-20 NOTE — ED Notes (Signed)
Bladder scan = 999 mL.

## 2017-03-20 NOTE — H&P (Signed)
History and Physical    Allison Marshall UJW:[id number] DOB: 03/12/1926 DOA: 03/20/2017  Referring MD/NP/PA: Dr. Lynden Oxfordhristopher Tegeler PCP: Patient, No Pcp Per  Patient coming from: Home via EMS  Chief Complaint: Lower abdominal mass  I have personally briefly reviewed patient[facility]   HPI: Allison BottomsBertha Marshall Marshall is a 84 y.o. female with medical history significant of Parkinson's Dz, GI bleed, PUD, history of CVA; who initially presented to the hospital for reports of a new lower abdominal mass.  History is obtained mostly from the patient's family members were present at bedside.  At baseline the patient was able to walk with a cane and lives alone with her daughter living behind her up until about 2 weeks ago.  Last seen in the emergency department on 1/16, for complaints of lower leg swelling diagnosed with venous stasis discharged home on doxycycline and Lasix.  Patient had about 4 days of diarrhea and thereafter has not had any significant bowel movement.  Since that time there has been a progressive decline.  Family reports that she has not gotten out of bed and has had poor appetite.  She has been trying to keep herself hydrated, but does not want much food.  Family also noted a sore on her sacrum for which they have been placing cream on to have pill.  Other associated symptoms include lethargy, decreased urine output, and mild productive cough with green sputum for the last 2-3 days.  Family brought her to the emergency room today due to swelling in the lower abdomen that that they noticed today.  Patient denies any significant fever, shortness of breath, chest pain, or reports of aspiration.    ED Course: Upon admission into the emergency department patient was noted to be  Review of Systems  Neurological: Positive for tremors.    Past Medical History:  Diagnosis Date  . GIB (gastrointestinal bleeding)   . Parkinson disease (HCC)   . PUD (peptic ulcer  disease)     Past Surgical History:  Procedure Laterality Date  . ESOPHAGOGASTRODUODENOSCOPY    . TEE WITHOUT CARDIOVERSION N/A 03/07/2015   Procedure: TRANSESOPHAGEAL ECHOCARDIOGRAM (TEE);  Surgeon: Wendall StadePeter C Nishan, MD;  [facility];  Service: Cardiovascular;  Laterality: N/A;     reports that  has never smoked. she has never used smokeless tobacco. She reports that she does not drink alcohol. Her drug history is not on file.  Allergies  Allergen Reactions  . Influenza Vac Split [Flu Virus Vaccine] Other (See Comments)    Family reports that patient had paralysis with flu vaccine    Family History  Problem Relation Age of Onset  . Lung cancer Mother   . Diabetes Mother   . Pancreatic cancer Father     Prior to Admission medications   Medication Sig Start Date End Date Taking? Authorizing Provider  acetaminophen (TYLENOL) 325 MG tablet Take 325-650 mg by mouth every 6 (six) hours as needed for moderate pain.   Yes [provider]  aspirin EC 81 MG tablet Take 81 mg by mouth daily.   Yes [provider]  propranolol (INDERAL) 80 MG tablet Take 80 mg by mouth every 12 (twelve) hours. 03/14/17  Yes [provider]  sulfamethoxazole-trimethoprim (BACTRIM DS,SEPTRA DS) 800-160 MG tablet Take 1 tablet by mouth as needed. 1 tablet q12 as needed for Cellulitis/ urine 03/14/17  Yes [provider]  aspirin 325 MG tablet Take 1 tablet (325 mg total) by  mouth daily. Patient not taking: Reported on 03/20/2017 03/07/15   Leroy Sea, MD  doxycycline (VIBRAMYCIN) 100 MG capsule Take 1 capsule (100 mg total) by mouth 2 (two) times daily. Patient not taking: Reported on 03/20/2017 03/07/17   Roxy Horseman, PA-C  furosemide (LASIX) 20 MG tablet Take 1 tablet (20 mg total) by mouth daily. Patient not taking: Reported on 03/20/2017 03/07/17   Roxy Horseman, PA-C  pantoprazole (PROTONIX) 40 MG tablet Take 1 tablet (40 mg total) by mouth  daily. Patient not taking: Reported on 03/20/2017 03/07/15   Leroy Sea, MD  pravastatin (PRAVACHOL) 20 MG tablet Take 1 tablet (20 mg total) by mouth daily at 6 PM. Patient not taking: Reported on 03/20/2017 03/07/15   Leroy Sea, MD    Physical Exam:  Constitutional: Thin elderly female able to follow commands Vitals:   03/20/17 2115 03/20/17 2145 03/20/17 2200 03/20/17 2215  BP: (!) 128/95 113/80 (!) 110/96 114/69  Pulse: 73 74 74 73  Resp: (!) 23 (!) 24 (!) 22 19  Temp:      TempSrc:      SpO2: 100% 98% 97% 98%  Weight:      Height:       Eyes: PERRL, lids and conjunctivae normal ENMT: Mucous membranes are dry. Posterior pharynx clear of any exudate or lesions. edentulous Neck: normal, supple, no masses, no thyromegaly Respiratory: clear to auscultation bilaterally, no wheezing, no crackles. Normal respiratory effort. No accessory muscle use.  Cardiovascular: Regular rate and rhythm, no murmurs / rubs / gallops. No extremity edema. 2+ pedal pulses. No carotid bruits.  Abdomen: no tenderness or mass appreciated at this time. No hepatosplenomegaly. Bowel sounds positive.  Foley catheter in place draining bloody appearing urine Musculoskeletal: no clubbing / cyanosis. No joint deformity upper and lower extremities. Good ROM, no contractures.  Muscle wasting noted. Skin: no rashes, lesions, ulcers. No induration Neurologic: CN 2-12 grossly intact.  Able to move all extremities.  Strength approximately 4/5 in all extremities. Psychiatric: Alert and oriented x 2. Normal mood.     Labs on Admission: I have personally reviewed following labs and imaging studies  CBC: Recent Labs  Lab 03/20/17 2011 03/20/17 2136  WBC 11.1*  --   NEUTROABS 8.3*  --   HGB 15.3* 14.6  HCT 45.2 43.0  MCV 93.8  --   PLT 139*  --    Basic Metabolic Panel: Recent Labs  Lab 03/20/17 2011 03/20/17 2136  NA 134* 134*  K 6.2* 4.8  CL 98* 101  CO2 23  --   GLUCOSE 88 84  BUN 33* 34*   CREATININE 0.87 0.80  CALCIUM 8.6*  --    GFR: Estimated Creatinine Clearance: 40.1 mL/min (by C-G formula based on SCr of 0.8 mg/dL). Liver Function Tests: Recent Labs  Lab 03/20/17 2011  AST 52*  ALT 13*  ALKPHOS 64  BILITOT 2.2*  PROT 5.7*  ALBUMIN 2.7*   Recent Labs  Lab 03/20/17 2011  LIPASE 24   No results for input(s): AMMONIA in the last 168 hours. Coagulation Profile: Recent Labs  Lab 03/20/17 2011  INR 1.00   Cardiac Enzymes: No results for input(s): CKTOTAL, CKMB, CKMBINDEX, TROPONINI in the last 168 hours. BNP (last 3 results) No results for input(s): PROBNP in the last 8760 hours. HbA1C: No results for input(s): HGBA1C in the last 72 hours. CBG: No results for input(s): GLUCAP in the last 168 hours. Lipid Profile: No results for input(s): CHOL, HDL,  LDLCALC, TRIG, CHOLHDL, LDLDIRECT in the last 72 hours. Thyroid Function Tests: Recent Labs    03/20/17 2011  TSH 3.304   Anemia Panel: No results for input(s): VITAMINB12, FOLATE, FERRITIN, TIBC, IRON, RETICCTPCT in the last 72 hours. Urine analysis:    Component Value Date/Time   COLORURINE YELLOW 03/20/2017 2114   APPEARANCEUR HAZY (A) 03/20/2017 2114   LABSPEC 1.014 03/20/2017 2114   PHURINE 5.0 03/20/2017 2114   GLUCOSEU NEGATIVE 03/20/2017 2114   HGBUR LARGE (A) 03/20/2017 2114   BILIRUBINUR NEGATIVE 03/20/2017 2114   KETONESUR NEGATIVE 03/20/2017 2114   PROTEINUR NEGATIVE 03/20/2017 2114   NITRITE NEGATIVE 03/20/2017 2114   LEUKOCYTESUR NEGATIVE 03/20/2017 2114   Sepsis Labs: No results found for this or any previous visit (from the past 240 hour(s)).   Radiological Exams on Admission: Dg Chest 2 View  Result Date: 03/20/2017 CLINICAL DATA:  Altered mental status. Productive cough. Not feeling well since last Thursday. Mass on the lower abdomen. EXAM: CHEST  2 VIEW COMPARISON:  03/02/2015 FINDINGS: Cardiac enlargement. Bilateral hilar enlargement, greatest on the right. This may  represent prominent pulmonary arteries due to pulmonary arterial hypertension but lymphadenopathy is also possible. Suggest CT chest for further evaluation. Emphysematous changes and scattered fibrosis in the lungs. No airspace disease or consolidation. Esophageal hiatal hernia behind the heart. Blunting of the costophrenic angles suggesting bilateral effusions or thickened pleura. Calcification of the aorta. Degenerative changes in the spine. IMPRESSION: 1. Cardiac enlargement.  No vascular congestion or edema. 2. Nonspecific hilar prominence, particularly on the right. Suggest CT to exclude lymphadenopathy. 3. Emphysematous changes and chronic bronchitic changes in the lungs. No focal consolidation. 4. Aortic atherosclerosis. Electronically Signed   By: Burman Nieves M.D.   On: 03/20/2017 21:46   Ct Head Wo Contrast  Result Date: 03/20/2017 CLINICAL DATA:  Altered level of consciousness. EXAM: CT HEAD WITHOUT CONTRAST TECHNIQUE: Contiguous axial images were obtained from the base of the skull through the vertex without intravenous contrast. COMPARISON:  03/02/2015 FINDINGS: Brain: Infarct again noted in the left posterior parietal lobe as seen on prior CT and MRI. New area of low-density noted in the right occipital and posterior temporal lobes compatible with acute to subacute infarct. No hemorrhage. No hydrocephalus. Vascular: No hyperdense vessel or unexpected calcification. Skull: No acute calvarial abnormality. Sinuses/Orbits: Near complete opacification of the left sphenoid sinus. Mucosal thickening throughout the remainder of the paranasal sinuses. Mastoids are clear. Other: None IMPRESSION: Stable left posterior parietal infarct. New acute to subacute infarct in the right occipital and posterior temporal lobes. Electronically Signed   By: Charlett Nose M.D.   On: 03/20/2017 22:14   Ct Chest W Contrast  Result Date: 03/20/2017 CLINICAL DATA:  84 y/o F; cough and chills. Mass of the lower abdomen.  EXAM: CT CHEST, ABDOMEN, AND PELVIS WITH CONTRAST TECHNIQUE: Multidetector CT imaging of the chest, abdomen and pelvis was performed following the standard protocol during bolus administration of intravenous contrast. CONTRAST:  ISOVUE-300 IOPAMIDOL (ISOVUE-300) INJECTION 61% COMPARISON:  None. FINDINGS: CT CHEST FINDINGS Cardiovascular: Mild cardiomegaly. Mitral annular calcification. Normal caliber thoracic aorta with mild calcific atherosclerosis. Mildly enlarged main pulmonary artery measuring 3.6 cm. Mediastinum/Nodes: 11 mm nodule within the left lobe of thyroid. No mediastinal adenopathy. Mildly patulous thoracic esophagus. Large hiatal hernia with a large portion of stomach contained in the lower mediastinum. Lungs/Pleura: Bilateral dependent lower lobe airway debris and areas of consolidation compatible with pneumonia, possibly aspiration given distribution. Small bilateral pleural effusions. No pneumothorax.  Musculoskeletal: No acute fracture. CT ABDOMEN PELVIS FINDINGS Hepatobiliary: No focal liver abnormality is seen. No gallstones, gallbladder wall thickening, or biliary dilatation. Pancreas: Unremarkable. No pancreatic ductal dilatation or surrounding inflammatory changes. Spleen: Normal in size without focal abnormality. Adrenals/Urinary Tract: Adrenal glands are unremarkable. Multiple renal cysts measuring up to 21 mm in the right kidney. Kidneys otherwise are normal, without renal calculi, focal lesion, or hydronephrosis. Bladder collapsed around Foley catheter. Stomach/Bowel: Stomach is within normal limits. Appendix not identified, no pericecal inflammation. No evidence of bowel wall thickening, distention, or inflammatory changes. Large volume of stool in the rectal vault. Vascular/Lymphatic: Aortic atherosclerosis. No enlarged abdominal or pelvic lymph nodes. Reproductive: Status post hysterectomy. No adnexal masses. Other: No abdominal wall hernia or abnormality. No abdominopelvic  ascites. Musculoskeletal: No fracture is seen. Advanced spondylosis of the lumbar spine. Multifactorial probable severe canal stenosis at the L3-4 level. IMPRESSION: 1. Bilateral dependent lower lobe airway debris and consolidation compatible with pneumonia, possibly aspiration given distribution. Small bilateral effusions. 2. Mild cardiomegaly. 3. Enlarged main pulmonary artery may represent pulmonary artery hypertension. 4. No acute process of abdomen or pelvis identified. 5. Advanced spondylosis of lumbar spine with probable multifactorial severe L3-4 canal stenosis. Electronically Signed   By: Mitzi Hansen M.D.   On: 03/20/2017 23:25   Ct Abdomen Pelvis W Contrast  Result Date: 03/20/2017 CLINICAL DATA:  84 y/o F; cough and chills. Mass of the lower abdomen. EXAM: CT CHEST, ABDOMEN, AND PELVIS WITH CONTRAST TECHNIQUE: Multidetector CT imaging of the chest, abdomen and pelvis was performed following the standard protocol during bolus administration of intravenous contrast. CONTRAST:  ISOVUE-300 IOPAMIDOL (ISOVUE-300) INJECTION 61% COMPARISON:  None. FINDINGS: CT CHEST FINDINGS Cardiovascular: Mild cardiomegaly. Mitral annular calcification. Normal caliber thoracic aorta with mild calcific atherosclerosis. Mildly enlarged main pulmonary artery measuring 3.6 cm. Mediastinum/Nodes: 11 mm nodule within the left lobe of thyroid. No mediastinal adenopathy. Mildly patulous thoracic esophagus. Large hiatal hernia with a large portion of stomach contained in the lower mediastinum. Lungs/Pleura: Bilateral dependent lower lobe airway debris and areas of consolidation compatible with pneumonia, possibly aspiration given distribution. Small bilateral pleural effusions. No pneumothorax. Musculoskeletal: No acute fracture. CT ABDOMEN PELVIS FINDINGS Hepatobiliary: No focal liver abnormality is seen. No gallstones, gallbladder wall thickening, or biliary dilatation. Pancreas: Unremarkable. No pancreatic  ductal dilatation or surrounding inflammatory changes. Spleen: Normal in size without focal abnormality. Adrenals/Urinary Tract: Adrenal glands are unremarkable. Multiple renal cysts measuring up to 21 mm in the right kidney. Kidneys otherwise are normal, without renal calculi, focal lesion, or hydronephrosis. Bladder collapsed around Foley catheter. Stomach/Bowel: Stomach is within normal limits. Appendix not identified, no pericecal inflammation. No evidence of bowel wall thickening, distention, or inflammatory changes. Large volume of stool in the rectal vault. Vascular/Lymphatic: Aortic atherosclerosis. No enlarged abdominal or pelvic lymph nodes. Reproductive: Status post hysterectomy. No adnexal masses. Other: No abdominal wall hernia or abnormality. No abdominopelvic ascites. Musculoskeletal: No fracture is seen. Advanced spondylosis of the lumbar spine. Multifactorial probable severe canal stenosis at the L3-4 level. IMPRESSION: 1. Bilateral dependent lower lobe airway debris and consolidation compatible with pneumonia, possibly aspiration given distribution. Small bilateral effusions. 2. Mild cardiomegaly. 3. Enlarged main pulmonary artery may represent pulmonary artery hypertension. 4. No acute process of abdomen or pelvis identified. 5. Advanced spondylosis of lumbar spine with probable multifactorial severe L3-4 canal stenosis. Electronically Signed   By: Mitzi Hansen M.D.   On: 03/20/2017 23:25    EKG: Independently reviewed.  Sinus/ectopic rhythm similar to  previous  Assessment/Plan CVA: Acute/subacute.  Patient noted to be unable to ambulate over the last 2 weeks.  CT scan showing new right occipital and posterior temporal lobes.  Neurology consulted. - Admit to telemetry bed - Stroke order set initiated - Neuro checks - Check MRI/MRA -  Vas carotid U/S - PT/OT/Speech to eval and treat - Check echocardiogram - Check Hemoglobin A1c and lipid panel in a.m. - ASA - Appreciate  neurology c onsultative services, will follow-up  - Social work consult   Community-acquired vs. aspiration pneumonia: Patient was reported to have developed a cough.  Imaging study showing signs suggestive of bilateral lobe pneumonia suggestive of possible aspiration. - Follow-up blood and sputum cultures - Continue empiric antibiotics ceftriaxone and azithromycin - Speech therapy consult  Bladder outlet obstruction: Acute.  Patient presented with lower abdominal swelling.  Found to have greater than 1000 mL of urine present on bladder scan.  Foley catheter placed in ED.  Urine changed more - Continue Foley catheter  Leukocytosis: Acute. WBC 11.1, but lactic acid noted to be reassuring at 1.04.  Suspect symptoms secondary to above. - Recheck CBC in a.m.   Hyperkalemia: Acute.  Initial potassium 6.2 on admission.   - Give 1 g of calcium gluconate - IV fluids as tolerated - Continue to monitor and treat as needed  Hematuria, question possible UTI: Initial urinalysis positive for blood.  Question possibility of underlying urinary tract infection.  Patient on adequate coverage with antibiotics noted above. - Follow-up repeat urine culture - Add on CPK to make sure no signs of rhabdomyolysis  Pressure ulcers patient noted to have a stage II pressure ulcer of the sacrum and new ones of the heels. - Low-air-loss mattress - Wound care consult  Elevated liver enzymes: Patient found to have elevated AST of 52 total bilirubin of 2.2. - Recheck liver enzymes in a.m.  Parkinson's disease: stable.   DVT prophylaxis: lovenox and subsequently discontinued will need to address DVT prophylaxis in a.m. Code Status: DNR Family Communication: Discussed plan of care with the patient and family present at bedside Disposition Plan: TBD  Consults called:  Neurology Admission status: Inpatient  Clydie Braun MD [facility] Pager [phone]   If 7PM-7AM, please contact  night-coverage [email or web address] Password [facility]  03/20/2017, 11:54 PM

## 2017-03-20 NOTE — ED Notes (Signed)
Patient transported to X-ray 

## 2017-03-20 NOTE — ED Notes (Signed)
CT already scanned head; EDP added on additional CT's. Unable to complete at this time, will bring pt back to room and come get her for additional CT's.

## 2017-03-20 NOTE — ED Provider Notes (Signed)
[facility] EMERGENCY DEPARTMENT Provider Note   CSN: [id number] Arrival date & time: 03/20/17  1906     History   Chief Complaint Chief Complaint  Patient presents with  . Mass    HPI Allison Marshall is a 84 y.o. female.  The history is provided by the patient, a relative and medical records. No language interpreter was used.  Illness  This is a new problem. Episode onset: fatigue. The problem occurs constantly. The problem has not changed since onset.Associated symptoms include abdominal pain and headaches. Pertinent negatives include no chest pain and no shortness of breath.  Abdominal Pain   This is a new problem. The current episode started yesterday. The problem occurs constantly. The problem has been gradually worsening. The pain is associated with an unknown factor. The pain is located in the suprapubic region. The quality of the pain is aching. The pain is moderate. Associated symptoms include constipation and headaches. Pertinent negatives include fever, diarrhea, nausea, vomiting, dysuria and frequency. Nothing aggravates the symptoms.  Cough  This is a new problem. The current episode started more than 2 days ago. The problem occurs constantly. The problem has not changed since onset.The cough is productive of sputum. There has been no fever. Associated symptoms include headaches. Pertinent negatives include no chest pain, no chills, no sweats, no rhinorrhea, no shortness of breath and no wheezing.    Past Medical History:  Diagnosis Date  . GIB (gastrointestinal bleeding)   . Parkinson disease (HCC)   . PUD (peptic ulcer disease)     Patient Active Problem List   Diagnosis Date Noted  . Difficulty speaking 03/02/2015  . PUD (peptic ulcer disease)   . Cerebral infarction due to unspecified mechanism   . Parkinson disease [facility])     Past Surgical History:  Procedure Laterality Date  . ESOPHAGOGASTRODUODENOSCOPY    . TEE WITHOUT  CARDIOVERSION N/A 03/07/2015   Procedure: TRANSESOPHAGEAL ECHOCARDIOGRAM (TEE);  Surgeon: Wendall Stade, MD;  Location: [facility] ENDOSCOPY;  Service: Cardiovascular;  Laterality: N/A;    OB History    No data available       Home Medications    Prior to Admission medications   Medication Sig Start Date End Date Taking? Authorizing Provider  acetaminophen (TYLENOL) 325 MG tablet Take 325-650 mg by mouth every 6 (six) hours as needed for moderate pain.    [provider]  aspirin 325 MG tablet Take 1 tablet (325 mg total) by mouth daily. 03/07/15   Leroy Sea, MD  carbidopa-levodopa (SINEMET IR) 25-100 MG tablet Take 1 tablet by mouth daily.    [provider]  Cholecalciferol (VITAMIN D PO) Take 1 tablet by mouth daily.    [provider]  doxycycline (VIBRAMYCIN) 100 MG capsule Take 1 capsule (100 mg total) by mouth 2 (two) times daily. 03/07/17   Roxy Horseman, PA-C  furosemide (LASIX) 20 MG tablet Take 1 tablet (20 mg total) by mouth daily. 03/07/17   Roxy Horseman, PA-C  pantoprazole (PROTONIX) 40 MG tablet Take 1 tablet (40 mg total) by mouth daily. 03/07/15   Leroy Sea, MD  pravastatin (PRAVACHOL) 20 MG tablet Take 1 tablet (20 mg total) by mouth daily at 6 PM. 03/07/15   Leroy Sea, MD    Family History Family History  Problem Relation Age of Onset  . Lung cancer Mother   . Diabetes Mother   . Pancreatic cancer Father     Social History Social History  Tobacco Use  . Smoking status: Never Smoker  . Smokeless tobacco: Never Used  Substance Use Topics  . Alcohol use: No  . Drug use: Not on file     Allergies   Influenza vac split [flu virus vaccine]   Review of Systems Review of Systems  Constitutional: Positive for appetite change and fatigue. Negative for chills, diaphoresis and fever.  HENT: Negative for congestion and rhinorrhea.   Respiratory: Positive for cough. Negative for choking, chest tightness, shortness  of breath, wheezing and stridor.   Cardiovascular: Negative for chest pain and palpitations.  Gastrointestinal: Positive for abdominal distention, abdominal pain and constipation. Negative for blood in stool, diarrhea, nausea and vomiting.  Genitourinary: Negative for dysuria and frequency.  Musculoskeletal: Negative for back pain, neck pain and neck stiffness.  Neurological: Positive for headaches. Negative for dizziness, light-headedness and numbness.  Psychiatric/Behavioral: Positive for confusion. Negative for agitation.  All other systems reviewed and are negative.    Physical Exam Updated Vital Signs BP 119/72   Pulse 72   Temp 98.7 F (37.1 C) (Axillary)   Resp 18   Ht 5\' 6"  (1.676 m)   Wt 52.2 kg (115 lb)   SpO2 100%   BMI 18.56 kg/m   Physical Exam  Constitutional: She is oriented to person, place, and time. She appears well-developed and well-nourished. No distress.  HENT:  Head: Normocephalic and atraumatic.  Mouth/Throat: Oropharynx is clear and moist. No oropharyngeal exudate.  Eyes: Conjunctivae and EOM are normal. Pupils are equal, round, and reactive to light.  Neck: Normal range of motion.  Cardiovascular: Normal rate and intact distal pulses.  Murmur heard. Pulmonary/Chest: Effort normal. No respiratory distress. She has no wheezes. She has rhonchi. She has no rales. She exhibits no tenderness.  Abdominal: She exhibits distension. There is generalized tenderness. There is no rigidity and no CVA tenderness.  Musculoskeletal: She exhibits no edema or tenderness.  Neurological: She is alert and oriented to person, place, and time. No cranial nerve deficit or sensory deficit. She exhibits abnormal muscle tone. Coordination normal.  Skin: Capillary refill takes less than 2 seconds. No rash noted. She is not diaphoretic. No erythema.  Psychiatric: She has a normal mood and affect.  Nursing note and vitals reviewed.    ED Treatments / Results  Labs (all labs  ordered are listed, but only abnormal results are displayed) Labs Reviewed  CBC WITH DIFFERENTIAL/PLATELET - Abnormal; Notable for the following components:      Result Value   WBC 11.1 (*)    Hemoglobin 15.3 (*)    Platelets 139 (*)    Neutro Abs 8.3 (*)    Monocytes Absolute 1.2 (*)    All other components within normal limits  COMPREHENSIVE METABOLIC PANEL - Abnormal; Notable for the following components:   Sodium 134 (*)    Potassium 6.2 (*)    Chloride 98 (*)    BUN 33 (*)    Calcium 8.6 (*)    Total Protein 5.7 (*)    Albumin 2.7 (*)    AST 52 (*)    ALT 13 (*)    Total Bilirubin 2.2 (*)    GFR calc non Af Amer 58 (*)    All other components within normal limits  BRAIN NATRIURETIC PEPTIDE - Abnormal; Notable for the following components:   B Natriuretic Peptide 194.9 (*)    All other components within normal limits  URINALYSIS, ROUTINE W REFLEX MICROSCOPIC - Abnormal; Notable for the following components:  APPearance HAZY (*)    Hgb urine dipstick LARGE (*)    Bacteria, UA RARE (*)    All other components within normal limits  URINALYSIS, ROUTINE W REFLEX MICROSCOPIC - Abnormal; Notable for the following components:   Color, Urine BROWN (*)    APPearance TURBID (*)    Hgb urine dipstick LARGE (*)    Bilirubin Urine MODERATE (*)    Ketones, ur 15 (*)    Protein, ur 100 (*)    Nitrite POSITIVE (*)    Leukocytes, UA MODERATE (*)    All other components within normal limits  URINALYSIS, MICROSCOPIC (REFLEX) - Abnormal; Notable for the following components:   Bacteria, UA MANY (*)    Squamous Epithelial / LPF 0-5 (*)    All other components within normal limits  CBC WITH DIFFERENTIAL/PLATELET - Abnormal; Notable for the following components:   Platelets 116 (*)    All other components within normal limits  COMPREHENSIVE METABOLIC PANEL - Abnormal; Notable for the following components:   Sodium 133 (*)    Chloride 99 (*)    Glucose, Bld 132 (*)    BUN 28 (*)     Calcium 8.1 (*)    Total Protein 5.1 (*)    Albumin 2.4 (*)    All other components within normal limits  PREALBUMIN - Abnormal; Notable for the following components:   Prealbumin 5.9 (*)    All other components within normal limits  LIPID PANEL - Abnormal; Notable for the following components:   HDL 25 (*)    All other components within normal limits  I-STAT TROPONIN, ED - Abnormal; Notable for the following components:   Troponin i, poc 0.26 (*)    All other components within normal limits  I-STAT CHEM 8, ED - Abnormal; Notable for the following components:   Sodium 134 (*)    BUN 34 (*)    Calcium, Ion 1.06 (*)    All other components within normal limits  I-STAT TROPONIN, ED - Abnormal; Notable for the following components:   Troponin i, poc 0.21 (*)    All other components within normal limits  URINE CULTURE  URINE CULTURE  CULTURE, BLOOD (ROUTINE X 2)  CULTURE, BLOOD (ROUTINE X 2)  GRAM STAIN  CULTURE, EXPECTORATED SPUTUM-ASSESSMENT  LIPASE, BLOOD  TSH  PROTIME-INR  CK  HEMOGLOBIN A1C  STREP PNEUMONIAE URINARY ANTIGEN  HIV ANTIBODY (ROUTINE TESTING)  LEGIONELLA PNEUMOPHILA SEROGP 1 UR AG  I-STAT CG4 LACTIC ACID, ED  I-STAT CG4 LACTIC ACID, ED    EKG  EKG Interpretation  Date/Time:  Wednesday March 20 2017 21:13:12 EST Ventricular Rate:  72 PR Interval:    QRS Duration: 97 QT Interval:  393 QTC Calculation: 431 R Axis:   154 Text Interpretation:  Sinus or ectopic atrial rhythm Probable RVH w/ secondary repol abnormality when compared to prior, no significant changes seen.  No STEMI Confirmed by Theda Belfast ([id number]) on 03/20/2017 9:24:06 PM       Radiology Dg Chest 2 View  Result Date: 03/20/2017 CLINICAL DATA:  Altered mental status. Productive cough. Not feeling well since last Thursday. Mass on the lower abdomen. EXAM: CHEST  2 VIEW COMPARISON:  03/02/2015 FINDINGS: Cardiac enlargement. Bilateral hilar enlargement, greatest on the right. This may  represent prominent pulmonary arteries due to pulmonary arterial hypertension but lymphadenopathy is also possible. Suggest CT chest for further evaluation. Emphysematous changes and scattered fibrosis in the lungs. No airspace disease or consolidation. Esophageal hiatal hernia behind the  heart. Blunting of the costophrenic angles suggesting bilateral effusions or thickened pleura. Calcification of the aorta. Degenerative changes in the spine. IMPRESSION: 1. Cardiac enlargement.  No vascular congestion or edema. 2. Nonspecific hilar prominence, particularly on the right. Suggest CT to exclude lymphadenopathy. 3. Emphysematous changes and chronic bronchitic changes in the lungs. No focal consolidation. 4. Aortic atherosclerosis. Electronically Signed   By: Burman Nieves M.D.   On: 03/20/2017 21:46   Ct Head Wo Contrast  Result Date: 03/20/2017 CLINICAL DATA:  Altered level of consciousness. EXAM: CT HEAD WITHOUT CONTRAST TECHNIQUE: Contiguous axial images were obtained from the base of the skull through the vertex without intravenous contrast. COMPARISON:  03/02/2015 FINDINGS: Brain: Infarct again noted in the left posterior parietal lobe as seen on prior CT and MRI. New area of low-density noted in the right occipital and posterior temporal lobes compatible with acute to subacute infarct. No hemorrhage. No hydrocephalus. Vascular: No hyperdense vessel or unexpected calcification. Skull: No acute calvarial abnormality. Sinuses/Orbits: Near complete opacification of the left sphenoid sinus. Mucosal thickening throughout the remainder of the paranasal sinuses. Mastoids are clear. Other: None IMPRESSION: Stable left posterior parietal infarct. New acute to subacute infarct in the right occipital and posterior temporal lobes. Electronically Signed   By: Charlett Nose M.D.   On: 03/20/2017 22:14   Ct Chest W Contrast  Result Date: 03/20/2017 CLINICAL DATA:  84 y/o F; cough and chills. Mass of the lower abdomen.  EXAM: CT CHEST, ABDOMEN, AND PELVIS WITH CONTRAST TECHNIQUE: Multidetector CT imaging of the chest, abdomen and pelvis was performed following the standard protocol during bolus administration of intravenous contrast. CONTRAST:  ISOVUE-300 IOPAMIDOL (ISOVUE-300) INJECTION 61% COMPARISON:  None. FINDINGS: CT CHEST FINDINGS Cardiovascular: Mild cardiomegaly. Mitral annular calcification. Normal caliber thoracic aorta with mild calcific atherosclerosis. Mildly enlarged main pulmonary artery measuring 3.6 cm. Mediastinum/Nodes: 11 mm nodule within the left lobe of thyroid. No mediastinal adenopathy. Mildly patulous thoracic esophagus. Large hiatal hernia with a large portion of stomach contained in the lower mediastinum. Lungs/Pleura: Bilateral dependent lower lobe airway debris and areas of consolidation compatible with pneumonia, possibly aspiration given distribution. Small bilateral pleural effusions. No pneumothorax. Musculoskeletal: No acute fracture. CT ABDOMEN PELVIS FINDINGS Hepatobiliary: No focal liver abnormality is seen. No gallstones, gallbladder wall thickening, or biliary dilatation. Pancreas: Unremarkable. No pancreatic ductal dilatation or surrounding inflammatory changes. Spleen: Normal in size without focal abnormality. Adrenals/Urinary Tract: Adrenal glands are unremarkable. Multiple renal cysts measuring up to 21 mm in the right kidney. Kidneys otherwise are normal, without renal calculi, focal lesion, or hydronephrosis. Bladder collapsed around Foley catheter. Stomach/Bowel: Stomach is within normal limits. Appendix not identified, no pericecal inflammation. No evidence of bowel wall thickening, distention, or inflammatory changes. Large volume of stool in the rectal vault. Vascular/Lymphatic: Aortic atherosclerosis. No enlarged abdominal or pelvic lymph nodes. Reproductive: Status post hysterectomy. No adnexal masses. Other: No abdominal wall hernia or abnormality. No abdominopelvic  ascites. Musculoskeletal: No fracture is seen. Advanced spondylosis of the lumbar spine. Multifactorial probable severe canal stenosis at the L3-4 level. IMPRESSION: 1. Bilateral dependent lower lobe airway debris and consolidation compatible with pneumonia, possibly aspiration given distribution. Small bilateral effusions. 2. Mild cardiomegaly. 3. Enlarged main pulmonary artery may represent pulmonary artery hypertension. 4. No acute process of abdomen or pelvis identified. 5. Advanced spondylosis of lumbar spine with probable multifactorial severe L3-4 canal stenosis. Electronically Signed   By: Mitzi Hansen M.D.   On: 03/20/2017 23:25   Mr Lucretia Kern  Contrast  Result Date: 03/21/2017 CLINICAL DATA:  84 y/o  F; stroke for follow-up. EXAM: MRI HEAD WITHOUT CONTRAST MRA HEAD WITHOUT CONTRAST TECHNIQUE: Multiplanar, multiecho pulse sequences of the brain and surrounding structures were obtained without intravenous contrast. Angiographic images of the head were obtained using MRA technique without contrast. COMPARISON:  03/20/2017 CT head.  03/03/2015 MRI head and MRA head. FINDINGS: MRI HEAD FINDINGS Brain: Large right PCA distribution infarct with mildly reduced diffusion, T2 FLAIR hyperintensity, and local mass effect, likely late acute/early subacute. Small foci of susceptibility hypointensity with T1 shortening are compatible with petechial hemorrhage. Partial effacement of the atria of right lateral ventricle. Several additional punctate infarcts are present within the bilateral frontal and parietal cortices and the right cerebellum with similar diffusion, also likely late acute/early subacute. Stable chronic infarction in the left lateral parietal lobe. Stable moderate chronic microvascular ischemic changes of white matter and parenchymal volume loss of the brain. Vascular: As below. Skull and upper cervical spine: Normal marrow signal. Sinuses/Orbits: No abnormal signal of mastoid air cells.  Bilateral intra-ocular lens replacement. Moderate paranasal sinus mucosal thickening and left sphenoid sinus opacification. Other: None. MRA HEAD FINDINGS Internal carotid arteries:  Patent. Anterior cerebral arteries:  Patent. Middle cerebral arteries: Patent. Anterior communicating artery: Patent. Posterior communicating arteries:  Patent. Posterior cerebral arteries:  Patent. Basilar artery:  Patent. Vertebral arteries:  Patent. No evidence of high-grade stenosis, large vessel occlusion, or aneurysm. IMPRESSION: 1. Large late acute/early subacute infarction in right PCA territory with petechial hemorrhage. Mild local mass effect. No herniation. 2. Additional punctate infarcts are present in bilateral frontal and parietal cortices as well as right cerebellar hemisphere. 3. Patent anterior and posterior intracranial circulation. No large vessel occlusion, aneurysm, or significant stenosis is identified. 4. Stable chronic microvascular ischemic changes and parenchymal volume loss of the brain. Stable small chronic infarct in left lateral parietal lobe. Electronically Signed   By: Mitzi Hansen M.D.   On: 03/21/2017 03:37   Mr Brain Wo Contrast  Result Date: 03/21/2017 CLINICAL DATA:  84 y/o  F; stroke for follow-up. EXAM: MRI HEAD WITHOUT CONTRAST MRA HEAD WITHOUT CONTRAST TECHNIQUE: Multiplanar, multiecho pulse sequences of the brain and surrounding structures were obtained without intravenous contrast. Angiographic images of the head were obtained using MRA technique without contrast. COMPARISON:  03/20/2017 CT head.  03/03/2015 MRI head and MRA head. FINDINGS: MRI HEAD FINDINGS Brain: Large right PCA distribution infarct with mildly reduced diffusion, T2 FLAIR hyperintensity, and local mass effect, likely late acute/early subacute. Small foci of susceptibility hypointensity with T1 shortening are compatible with petechial hemorrhage. Partial effacement of the atria of right lateral ventricle.  Several additional punctate infarcts are present within the bilateral frontal and parietal cortices and the right cerebellum with similar diffusion, also likely late acute/early subacute. Stable chronic infarction in the left lateral parietal lobe. Stable moderate chronic microvascular ischemic changes of white matter and parenchymal volume loss of the brain. Vascular: As below. Skull and upper cervical spine: Normal marrow signal. Sinuses/Orbits: No abnormal signal of mastoid air cells. Bilateral intra-ocular lens replacement. Moderate paranasal sinus mucosal thickening and left sphenoid sinus opacification. Other: None. MRA HEAD FINDINGS Internal carotid arteries:  Patent. Anterior cerebral arteries:  Patent. Middle cerebral arteries: Patent. Anterior communicating artery: Patent. Posterior communicating arteries:  Patent. Posterior cerebral arteries:  Patent. Basilar artery:  Patent. Vertebral arteries:  Patent. No evidence of high-grade stenosis, large vessel occlusion, or aneurysm. IMPRESSION: 1. Large late acute/early subacute infarction in right PCA territory with  petechial hemorrhage. Mild local mass effect. No herniation. 2. Additional punctate infarcts are present in bilateral frontal and parietal cortices as well as right cerebellar hemisphere. 3. Patent anterior and posterior intracranial circulation. No large vessel occlusion, aneurysm, or significant stenosis is identified. 4. Stable chronic microvascular ischemic changes and parenchymal volume loss of the brain. Stable small chronic infarct in left lateral parietal lobe. Electronically Signed   By: Mitzi Hansen M.D.   On: 03/21/2017 03:37   Ct Abdomen Pelvis W Contrast  Result Date: 03/20/2017 CLINICAL DATA:  84 y/o F; cough and chills. Mass of the lower abdomen. EXAM: CT CHEST, ABDOMEN, AND PELVIS WITH CONTRAST TECHNIQUE: Multidetector CT imaging of the chest, abdomen and pelvis was performed following the standard protocol during  bolus administration of intravenous contrast. CONTRAST:  ISOVUE-300 IOPAMIDOL (ISOVUE-300) INJECTION 61% COMPARISON:  None. FINDINGS: CT CHEST FINDINGS Cardiovascular: Mild cardiomegaly. Mitral annular calcification. Normal caliber thoracic aorta with mild calcific atherosclerosis. Mildly enlarged main pulmonary artery measuring 3.6 cm. Mediastinum/Nodes: 11 mm nodule within the left lobe of thyroid. No mediastinal adenopathy. Mildly patulous thoracic esophagus. Large hiatal hernia with a large portion of stomach contained in the lower mediastinum. Lungs/Pleura: Bilateral dependent lower lobe airway debris and areas of consolidation compatible with pneumonia, possibly aspiration given distribution. Small bilateral pleural effusions. No pneumothorax. Musculoskeletal: No acute fracture. CT ABDOMEN PELVIS FINDINGS Hepatobiliary: No focal liver abnormality is seen. No gallstones, gallbladder wall thickening, or biliary dilatation. Pancreas: Unremarkable. No pancreatic ductal dilatation or surrounding inflammatory changes. Spleen: Normal in size without focal abnormality. Adrenals/Urinary Tract: Adrenal glands are unremarkable. Multiple renal cysts measuring up to 21 mm in the right kidney. Kidneys otherwise are normal, without renal calculi, focal lesion, or hydronephrosis. Bladder collapsed around Foley catheter. Stomach/Bowel: Stomach is within normal limits. Appendix not identified, no pericecal inflammation. No evidence of bowel wall thickening, distention, or inflammatory changes. Large volume of stool in the rectal vault. Vascular/Lymphatic: Aortic atherosclerosis. No enlarged abdominal or pelvic lymph nodes. Reproductive: Status post hysterectomy. No adnexal masses. Other: No abdominal wall hernia or abnormality. No abdominopelvic ascites. Musculoskeletal: No fracture is seen. Advanced spondylosis of the lumbar spine. Multifactorial probable severe canal stenosis at the L3-4 level. IMPRESSION: 1. Bilateral  dependent lower lobe airway debris and consolidation compatible with pneumonia, possibly aspiration given distribution. Small bilateral effusions. 2. Mild cardiomegaly. 3. Enlarged main pulmonary artery may represent pulmonary artery hypertension. 4. No acute process of abdomen or pelvis identified. 5. Advanced spondylosis of lumbar spine with probable multifactorial severe L3-4 canal stenosis. Electronically Signed   By: Mitzi Hansen M.D.   On: 03/20/2017 23:25    Procedures Procedures (including critical care time)  CRITICAL CARE Performed by: Canary Brim Arelly Whittenberg Total critical care time: 35 minutes Critical care time was exclusive of separately billable procedures and treating other patients. PNA with positive troponin requiring fluids and abx, also new stroke Critical care was necessary to treat or prevent imminent or life-threatening deterioration. Critical care was time spent personally by me on the following activities: development of treatment plan with patient and/or surrogate as well as nursing, discussions with consultants, evaluation of patient's response to treatment, examination of patient, obtaining history from patient or surrogate, ordering and performing treatments and interventions, ordering and review of laboratory studies, ordering and review of radiographic studies, pulse oximetry and re-evaluation of patient's condition.  Medications Ordered in ED Medications  aspirin EC tablet 81 mg (81 mg Oral Given 03/21/17 0927)  pantoprazole (PROTONIX) EC tablet 40 mg (40  mg Oral Given 03/21/17 0927)  acetaminophen (TYLENOL) tablet 650 mg (not administered)    Or  acetaminophen (TYLENOL) solution 650 mg (not administered)    Or  acetaminophen (TYLENOL) suppository 650 mg (not administered)  senna-docusate (Senokot-S) tablet 1 tablet (not administered)  azithromycin (ZITHROMAX) 500 mg in dextrose 5 % 250 mL IVPB (not administered)  cefTRIAXone (ROCEPHIN) 1 g in dextrose  5 % 50 mL IVPB (not administered)  iopamidol (ISOVUE-300) 61 % injection (100 mLs  Contrast Given 03/20/17 2258)  cefTRIAXone (ROCEPHIN) 1 g in dextrose 5 % 50 mL IVPB (0 g Intravenous Stopped 03/21/17 0100)  azithromycin (ZITHROMAX) 500 mg in dextrose 5 % 250 mL IVPB (0 mg Intravenous Stopped 03/21/17 0204)  sodium chloride 0.9 % bolus 500 mL (0 mLs Intravenous Stopped 03/21/17 0104)   stroke: mapping our early stages of recovery book ( Does not apply Given 03/21/17 0900)  calcium gluconate 1 g in sodium chloride 0.9 % 100 mL IVPB (0 g Intravenous Stopped 03/21/17 0432)     Initial Impression / Assessment and Plan / ED Course  I have reviewed the triage vital signs and the nursing notes.  Pertinent labs & imaging results that were available during my care of the patient were reviewed by me and considered in my medical decision making (see chart for details).     Allison Marshall is a 84 y.o. female with a past medical history significant for prior stroke with speech abnormality's, prior GI bleed, recent cellulitis, and recent urinary tract infection who presents for multiple complaints including fatigue, chills, productive cough, constipation with no bowel movement in 4 days, and a large bulge in her lower abdomen.  Patient is accompanied by family who reports that over the last few days, patient has had significant increase in fatigue.  She is now unable to get out of bed due to feeling tired.  She has had decreased oral intake and is not eating or drinking.  She has not had a complaint of chest pain or shortness of breath but has had a productive cough with a white sputum.  She has not had nausea or vomiting but has not had a bowel movement for days.  This is new for her.  Patient has had a recent cellulitis of the lower extremity's and was on doxycycline.  Patient was then subsequently found to have UTI and was switched to Bactrim last week.  Patient has been taking his medications.  On exam,  patient has some rhonchi in her lungs bilaterally.  No significant wheezing.  Chest was nontender.  Murmur was appreciated.  Abdomen was tender over the supra pubic area that was very swollen and fluctuant.  Feels as if there is a very distended bladder present.  Patient will sensation throughout but had slightly decreased strength in her left leg with raising compared to the right.  Very mild discrepancy.  Bladder scan was performed showing greater than 1000 mL in her bladder.  Foley catheter was placed by me.  Urinalysis will be performed on it.  Suspect retention may be due to infection versus Bactrim.  Patient will have workup to look for occult infection as etiology of her fatigue, chills, productive cough, and symptoms.  EKG, chest x-ray, CT head, will be performed.    Laboratory testing showed positive troponin of 0.26.  This is increased from prior.  CBC shows mild leukocytosis and elevated hemoglobin.  Suspect hemoconcentration and dehydration.  Metabolic panel showed normal creatinine however potassium was 6.2.  Potassium will be repeated. lipase not elevated.  BNP still pending.  Anticipate reassessment after workup.  10:19 PM I-STAT showed normal potassium now.  Lactic acid normal.  Urinalysis shows no evidence of infection.  Patient still has bladder outlet obstruction of some kind.  Likely secondary to the medication use.  Patient still has a mild leukocytosis.  Chest x-ray shows abnormalities that radiology requested CT to further evaluate.  CT of the chest, abdomen, and pelvis was ordered given the patient's abdominal tenderness in the R normality's on imaging.  CT scan of the head showed stable left posterior infarct however there was new acute versus subacute infarct in the right brain.  Neurology will be called.  Suspect that they will need MRI.  Patient will be admitted for further workup and management of severe fatigue, decreased intake, elevated troponin, bladder  obstruction, and imaging revealing new stroke.  On reassessment, patient's urine coming through the catheter appears infected.  We will recheck a urinalysis.  After CT imaging of the chest, abdomen, and pelvis, patient will need admission.    11:36 PM CT imaging confirmed pneumonia.  Patient had blood cultures and antibiotics ordered.  Admission team will be called for pneumonia with positive troponin, urinary obstruction, and possible stroke   Final Clinical Impressions(s) / ED Diagnoses   Final diagnoses:  Community acquired pneumonia, unspecified laterality  Bladder obstruction  Generalized abdominal pain  Fatigue, unspecified type    ED Discharge Orders    None      Clinical Impression: 1. Community acquired pneumonia, unspecified laterality   2. Bladder obstruction   3. Generalized abdominal pain   4. Fatigue, unspecified type     Disposition: Admit  This note was prepared with assistance of Dragon voice recognition software. Occasional wrong-word or sound-a-like substitutions may have occurred due to the inherent limitations of voice recognition software.     Jame Morrell, Canary Brim, MD 03/21/17 [phone]

## 2017-03-20 NOTE — Consult Note (Signed)
Referring Physician: Dr. Sherry Ruffing    Chief Complaint: New stroke seen on CT  HPI: Allison Marshall is an 84 y.o. female who presents with progressive decline. Multiple medical abnormalities were diagnosed and/or addressed in the ED, including a new right occipital/temporal ischemic stroke based upon CT head findings. She has been started on antibiotics for a presumed CAP versus aspiration pneumonia.   She lives alone with her daughter. At baseline the patient is able to walk with a cane. Her PMHx includes Parkinson's disease and prior CVA. Current problem list includes acute bladder outlet obstruction, PNA, hyperkalemia, hematuria, possible UTI, elevated liver enzymes, and pressure ulcers at sacrum and heels.   CT head obtained in the ED reveals a new subacute ischemic infarction involving the right occipital and posterior temporal lobe. A stable left posterior parietal infarct is also noted.   Past Medical History:  Diagnosis Date  . GIB (gastrointestinal bleeding)   . Parkinson disease ([place])   . PUD (peptic ulcer disease)     Past Surgical History:  Procedure Laterality Date  . ESOPHAGOGASTRODUODENOSCOPY    . TEE WITHOUT CARDIOVERSION N/A 03/07/2015   Procedure: TRANSESOPHAGEAL ECHOCARDIOGRAM (TEE);  Surgeon: Josue Hector, MD;  Location: [facility] ENDOSCOPY;  Service: Cardiovascular;  Laterality: N/A;    Family History  Problem Relation Age of Onset  . Lung cancer Mother   . Diabetes Mother   . Pancreatic cancer Father    Social History:  reports that  has never smoked. she has never used smokeless tobacco. She reports that she does not drink alcohol. Her drug history is not on file.  Allergies:  Allergies  Allergen Reactions  . Influenza Vac Split [Flu Virus Vaccine] Other (See Comments)    Family reports that patient had paralysis with flu vaccine    Home Medications:  Tylenol ASA Bactrim Doxycycline Lasix Protonix Pravastatin  GHW:EXHBZJIRCVELF **OR** acetaminophen  (TYLENOL) oral liquid 160 mg/5 mL **OR** acetaminophen, senna-docusate  ROS: The patient is a poor historian and is unable to provide a reliable ROS.   Physical Examination: Blood pressure 114/69, pulse 73, temperature 98.7 F (37.1 C), temperature source Axillary, resp. rate 19, height '5\' 6"'  (1.676 m), weight 52.2 kg (115 lb), SpO2 98 %.  General: Frail appearing HEENT: [place]/AT Lungs: Non-tachypneic Ext: Edema LLE noted  Neurologic Examination: Mental Status: Initially asleep, she arouses easily to an awake and alert state. Oriented to self, city, day, and state. Speech is fluent with intact comprehension. Named common objects but was unable to name a badge.  Cranial Nerves: II:  In the context of the CT findings, she remains able to blink to threat in right and left visual fields as well as being able to identify movement in her peripheral visual fields to left and right. PERRL.  III,IV, VI: Squints left eye, but non-ptotic. EOMI without nystagmus. V,VII: Smile symmetric. Cheek puff symmetric. Normal reaction to FT.  VIII: Hearing intact to voice IX,X: Hypophonic speech XI: Symmetric XII: midline tongue extension  Prominent rest tremor involving her jaw Motor: Right : Upper extremity   4/5    Left:     Upper extremity   4/5  Lower extremity   3/5 HF, otw 4/5   Lower extremity    3/5 HF, otw 4/5  Diffusely decreased muscle bulk x 4.  Prominent rest tremor to hands and wrists bilaterally.  Sensory: Reacts to FT normally x 4 Deep Tendon Reflexes:  2+ bilateral upper extremities.  1+ patellae 0 achilles bilaterally Plantars: Mute  bilaterally  Cerebellar: No ataxia with FNF bilaterally. Action tremor noted on the left.  Gait: Deferred  Results for orders placed or performed during the hospital encounter of 03/20/17 (from the past 48 hour(s))  CBC with Differential     Status: Abnormal   Collection Time: 03/20/17  8:11 PM  Result Value Ref Range   WBC 11.1 (H) 4.0 - 10.5 K/uL    RBC 4.82 3.87 - 5.11 MIL/uL   Hemoglobin 15.3 (H) 12.0 - 15.0 g/dL   HCT 45.2 36.0 - 46.0 %   MCV 93.8 78.0 - 100.0 fL   MCH 31.7 26.0 - 34.0 pg   MCHC 33.8 30.0 - 36.0 g/dL   RDW 12.5 11.5 - 15.5 %   Platelets 139 (L) 150 - 400 K/uL   Neutrophils Relative % 74 %   Lymphocytes Relative 12 %   Monocytes Relative 11 %   Eosinophils Relative 2 %   Basophils Relative 1 %   Neutro Abs 8.3 (H) 1.7 - 7.7 K/uL   Lymphs Abs 1.3 0.7 - 4.0 K/uL   Monocytes Absolute 1.2 (H) 0.1 - 1.0 K/uL   Eosinophils Absolute 0.2 0.0 - 0.7 K/uL   Basophils Absolute 0.1 0.0 - 0.1 K/uL   WBC Morphology FEW ATYPICAL LYMPHS NOTED   Comprehensive metabolic panel     Status: Abnormal   Collection Time: 03/20/17  8:11 PM  Result Value Ref Range   Sodium 134 (L) 135 - 145 mmol/L   Potassium 6.2 (H) 3.5 - 5.1 mmol/L   Chloride 98 (L) 101 - 111 mmol/L   CO2 23 22 - 32 mmol/L   Glucose, Bld 88 65 - 99 mg/dL   BUN 33 (H) 6 - 20 mg/dL   Creatinine, Ser 0.87 0.44 - 1.00 mg/dL   Calcium 8.6 (L) 8.9 - 10.3 mg/dL   Total Protein 5.7 (L) 6.5 - 8.1 g/dL   Albumin 2.7 (L) 3.5 - 5.0 g/dL   AST 52 (H) 15 - 41 U/L   ALT 13 (L) 14 - 54 U/L   Alkaline Phosphatase 64 38 - 126 U/L   Total Bilirubin 2.2 (H) 0.3 - 1.2 mg/dL   GFR calc non Af Amer 58 (L) >60 mL/min   GFR calc Af Amer >60 >60 mL/min    Comment: (NOTE) The eGFR has been calculated using the CKD EPI equation. This calculation has not been validated in all clinical situations. eGFR's persistently <60 mL/min signify possible Chronic Kidney Disease.    Anion gap 13 5 - 15  Lipase, blood     Status: None   Collection Time: 03/20/17  8:11 PM  Result Value Ref Range   Lipase 24 11 - 51 U/L  TSH     Status: None   Collection Time: 03/20/17  8:11 PM  Result Value Ref Range   TSH 3.304 0.350 - 4.500 uIU/mL    Comment: Performed by a 3rd Generation assay with a functional sensitivity of <=0.01 uIU/mL.  Protime-INR     Status: None   Collection Time: 03/20/17  8:11  PM  Result Value Ref Range   Prothrombin Time 13.1 11.4 - 15.2 seconds   INR 1.00   Brain natriuretic peptide     Status: Abnormal   Collection Time: 03/20/17  8:11 PM  Result Value Ref Range   B Natriuretic Peptide 194.9 (H) 0.0 - 100.0 pg/mL  I-Stat Troponin, ED (not at [facility])     Status: Abnormal   Collection Time: 03/20/17  8:17  PM  Result Value Ref Range   Troponin i, poc 0.26 (HH) 0.00 - 0.08 ng/mL   Comment NOTIFIED PHYSICIAN    Comment 3            Comment: Due to the release kinetics of cTnI, a negative result within the first hours of the onset of symptoms does not rule out myocardial infarction with certainty. If myocardial infarction is still suspected, repeat the test at appropriate intervals.   I-Stat CG4 Lactic Acid, ED     Status: None   Collection Time: 03/20/17  8:19 PM  Result Value Ref Range   Lactic Acid, Venous 1.04 0.5 - 1.9 mmol/L  Urinalysis, Routine w reflex microscopic     Status: Abnormal   Collection Time: 03/20/17  9:14 PM  Result Value Ref Range   Color, Urine YELLOW YELLOW   APPearance HAZY (A) CLEAR   Specific Gravity, Urine 1.014 1.005 - 1.030   pH 5.0 5.0 - 8.0   Glucose, UA NEGATIVE NEGATIVE mg/dL   Hgb urine dipstick LARGE (A) NEGATIVE   Bilirubin Urine NEGATIVE NEGATIVE   Ketones, ur NEGATIVE NEGATIVE mg/dL   Protein, ur NEGATIVE NEGATIVE mg/dL   Nitrite NEGATIVE NEGATIVE   Leukocytes, UA NEGATIVE NEGATIVE   RBC / HPF TOO NUMEROUS TO COUNT 0 - 5 RBC/hpf   WBC, UA 0-5 0 - 5 WBC/hpf   Bacteria, UA RARE (A) NONE SEEN   Squamous Epithelial / LPF NONE SEEN NONE SEEN   Mucus PRESENT   I-Stat Chem 8, ED     Status: Abnormal   Collection Time: 03/20/17  9:36 PM  Result Value Ref Range   Sodium 134 (L) 135 - 145 mmol/L   Potassium 4.8 3.5 - 5.1 mmol/L   Chloride 101 101 - 111 mmol/L   BUN 34 (H) 6 - 20 mg/dL   Creatinine, Ser 0.80 0.44 - 1.00 mg/dL   Glucose, Bld 84 65 - 99 mg/dL   Calcium, Ion 1.06 (L) 1.15 - 1.40 mmol/L   TCO2 25 22 -  32 mmol/L   Hemoglobin 14.6 12.0 - 15.0 g/dL   HCT 43.0 36.0 - 46.0 %  I-Stat CG4 Lactic Acid, ED     Status: None   Collection Time: 03/20/17  9:37 PM  Result Value Ref Range   Lactic Acid, Venous 1.20 0.5 - 1.9 mmol/L   Dg Chest 2 View  Result Date: 03/20/2017 CLINICAL DATA:  Altered mental status. Productive cough. Not feeling well since last Thursday. Mass on the lower abdomen. EXAM: CHEST  2 VIEW COMPARISON:  03/02/2015 FINDINGS: Cardiac enlargement. Bilateral hilar enlargement, greatest on the right. This may represent prominent pulmonary arteries due to pulmonary arterial hypertension but lymphadenopathy is also possible. Suggest CT chest for further evaluation. Emphysematous changes and scattered fibrosis in the lungs. No airspace disease or consolidation. Esophageal hiatal hernia behind the heart. Blunting of the costophrenic angles suggesting bilateral effusions or thickened pleura. Calcification of the aorta. Degenerative changes in the spine. IMPRESSION: 1. Cardiac enlargement.  No vascular congestion or edema. 2. Nonspecific hilar prominence, particularly on the right. Suggest CT to exclude lymphadenopathy. 3. Emphysematous changes and chronic bronchitic changes in the lungs. No focal consolidation. 4. Aortic atherosclerosis. Electronically Signed   By: Lucienne Capers M.D.   On: 03/20/2017 21:46   Ct Head Wo Contrast  Result Date: 03/20/2017 CLINICAL DATA:  Altered level of consciousness. EXAM: CT HEAD WITHOUT CONTRAST TECHNIQUE: Contiguous axial images were obtained from the base of the skull through  the vertex without intravenous contrast. COMPARISON:  03/02/2015 FINDINGS: Brain: Infarct again noted in the left posterior parietal lobe as seen on prior CT and MRI. New area of low-density noted in the right occipital and posterior temporal lobes compatible with acute to subacute infarct. No hemorrhage. No hydrocephalus. Vascular: No hyperdense vessel or unexpected calcification. Skull:  No acute calvarial abnormality. Sinuses/Orbits: Near complete opacification of the left sphenoid sinus. Mucosal thickening throughout the remainder of the paranasal sinuses. Mastoids are clear. Other: None IMPRESSION: Stable left posterior parietal infarct. New acute to subacute infarct in the right occipital and posterior temporal lobes. Electronically Signed   By: Rolm Baptise M.D.   On: 03/20/2017 22:14    Assessment: 84 y.o. female with new acute to subacute infarct in the right occipital and posterior temporal lobes 1. Neurological exam reveals, surprisingly, no visual field cut 2. Stroke Risk Factors - Prior CVA 3. Parkinson's disease 4. Multiple active medical problems, including PNA, volume depletion and acute bladder outlet obstruction.   Plan: 1. HgbA1c, fasting lipid panel 2. MRI, MRA  of the brain without contrast 3. PT consult, OT consult, Speech consult 4. Echocardiogram 5. Carotid dopplers 6. Prophylactic therapy- ASA 81 mg po qd 7. Risk factor modification 8. Telemetry monitoring 9. Frequent neuro checks 10. Management of other medical problems as per primary team. 11. Outpatient Neurology follow up for evaluation/management of Parkinson's disease  '@Electronically'  signed: Dr. Kerney Elbe 03/20/2017, 11:22 PM

## 2017-03-21 ENCOUNTER — Inpatient Hospital Stay (HOSPITAL_COMMUNITY): Payer: Medicare Other

## 2017-03-21 ENCOUNTER — Encounter (HOSPITAL_COMMUNITY): Payer: Medicare Other

## 2017-03-21 ENCOUNTER — Inpatient Hospital Stay (HOSPITAL_COMMUNITY)
Admit: 2017-03-21 | Discharge: 2017-03-21 | Disposition: A | Discharge status: Home / Self Care | Payer: Medicare Other | Attending: Neurology | Admitting: Neurology

## 2017-03-21 DIAGNOSIS — I351 Nonrheumatic aortic (valve) insufficiency: Secondary | ICD-10-CM | POA: Diagnosis not present

## 2017-03-21 DIAGNOSIS — G20 Parkinson's disease: Secondary | ICD-10-CM | POA: Diagnosis present

## 2017-03-21 DIAGNOSIS — I639 Cerebral infarction, unspecified: Secondary | ICD-10-CM | POA: Diagnosis present

## 2017-03-21 DIAGNOSIS — Z887 Allergy status to serum and vaccine status: Secondary | ICD-10-CM | POA: Diagnosis not present

## 2017-03-21 DIAGNOSIS — E875 Hyperkalemia: Secondary | ICD-10-CM | POA: Diagnosis present

## 2017-03-21 DIAGNOSIS — J189 Pneumonia, unspecified organism: Secondary | ICD-10-CM | POA: Diagnosis present

## 2017-03-21 DIAGNOSIS — I63411 Cerebral infarction due to embolism of right middle cerebral artery: Secondary | ICD-10-CM | POA: Diagnosis present

## 2017-03-21 DIAGNOSIS — N3001 Acute cystitis with hematuria: Secondary | ICD-10-CM | POA: Diagnosis present

## 2017-03-21 DIAGNOSIS — I10 Essential (primary) hypertension: Secondary | ICD-10-CM | POA: Diagnosis present

## 2017-03-21 DIAGNOSIS — Z66 Do not resuscitate: Secondary | ICD-10-CM | POA: Diagnosis present

## 2017-03-21 DIAGNOSIS — I82413 Acute embolism and thrombosis of femoral vein, bilateral: Secondary | ICD-10-CM | POA: Diagnosis present

## 2017-03-21 DIAGNOSIS — Q211 Atrial septal defect: Secondary | ICD-10-CM | POA: Diagnosis not present

## 2017-03-21 DIAGNOSIS — N320 Bladder-neck obstruction: Secondary | ICD-10-CM | POA: Diagnosis present

## 2017-03-21 DIAGNOSIS — I69328 Other speech and language deficits following cerebral infarction: Secondary | ICD-10-CM | POA: Diagnosis not present

## 2017-03-21 DIAGNOSIS — A4151 Sepsis due to Escherichia coli [E. coli]: Secondary | ICD-10-CM | POA: Diagnosis present

## 2017-03-21 DIAGNOSIS — Z7982 Long term (current) use of aspirin: Secondary | ICD-10-CM | POA: Diagnosis not present

## 2017-03-21 DIAGNOSIS — N139 Obstructive and reflux uropathy, unspecified: Secondary | ICD-10-CM | POA: Diagnosis present

## 2017-03-21 DIAGNOSIS — I6340 Cerebral infarction due to embolism of unspecified cerebral artery: Secondary | ICD-10-CM | POA: Diagnosis not present

## 2017-03-21 DIAGNOSIS — L89159 Pressure ulcer of sacral region, unspecified stage: Secondary | ICD-10-CM | POA: Diagnosis present

## 2017-03-21 DIAGNOSIS — I82409 Acute embolism and thrombosis of unspecified deep veins of unspecified lower extremity: Secondary | ICD-10-CM

## 2017-03-21 DIAGNOSIS — L89152 Pressure ulcer of sacral region, stage 2: Secondary | ICD-10-CM | POA: Diagnosis present

## 2017-03-21 DIAGNOSIS — I63431 Cerebral infarction due to embolism of right posterior cerebral artery: Secondary | ICD-10-CM | POA: Diagnosis present

## 2017-03-21 DIAGNOSIS — Z79899 Other long term (current) drug therapy: Secondary | ICD-10-CM | POA: Diagnosis not present

## 2017-03-21 DIAGNOSIS — R29703 NIHSS score 3: Secondary | ICD-10-CM | POA: Diagnosis present

## 2017-03-21 DIAGNOSIS — E785 Hyperlipidemia, unspecified: Secondary | ICD-10-CM | POA: Diagnosis present

## 2017-03-21 DIAGNOSIS — D72829 Elevated white blood cell count, unspecified: Secondary | ICD-10-CM | POA: Diagnosis present

## 2017-03-21 LAB — LIPID PANEL
CHOL/HDL RATIO: 5.5 ratio
Cholesterol: 138 mg/dL (ref 0–200)
HDL: 25 mg/dL — AB (ref >40)
LDL Cholesterol: 88 mg/dL (ref 0–99)
Triglycerides: 125 mg/dL (ref <150)
VLDL: 25 mg/dL (ref 0–40)

## 2017-03-21 LAB — CBC WITH DIFFERENTIAL/PLATELET
Basophils Absolute: 0 10*3/uL (ref 0.0–0.1)
Basophils Relative: 0 %
Eosinophils Absolute: 0.1 10*3/uL (ref 0.0–0.7)
Eosinophils Relative: 1 %
HCT: 40.7 % (ref 36.0–46.0)
Hemoglobin: 13.6 g/dL (ref 12.0–15.0)
Lymphocytes Relative: 13 %
Lymphs Abs: 1.3 10*3/uL (ref 0.7–4.0)
MCH: 31.4 pg (ref 26.0–34.0)
MCHC: 33.4 g/dL (ref 30.0–36.0)
MCV: 94 fL (ref 78.0–100.0)
Monocytes Absolute: 1 10*3/uL (ref 0.1–1.0)
Monocytes Relative: 10 %
Neutro Abs: 7.3 10*3/uL (ref 1.7–7.7)
Neutrophils Relative %: 76 %
Platelets: 116 10*3/uL — ABNORMAL LOW (ref 150–400)
RBC: 4.33 MIL/uL (ref 3.87–5.11)
RDW: 12.2 % (ref 11.5–15.5)
WBC: 9.7 10*3/uL (ref 4.0–10.5)

## 2017-03-21 LAB — COMPREHENSIVE METABOLIC PANEL
ALBUMIN: 2.4 g/dL — AB (ref 3.5–5.0)
ALT: 15 U/L (ref 14–54)
AST: 31 U/L (ref 15–41)
Alkaline Phosphatase: 54 U/L (ref 38–126)
Anion gap: 11 (ref 5–15)
BILIRUBIN TOTAL: 1.2 mg/dL (ref 0.3–1.2)
BUN: 28 mg/dL — AB (ref 6–20)
CALCIUM: 8.1 mg/dL — AB (ref 8.9–10.3)
CO2: 23 mmol/L (ref 22–32)
CREATININE: 0.79 mg/dL (ref 0.44–1.00)
Chloride: 99 mmol/L — ABNORMAL LOW (ref 101–111)
GFR calc Af Amer: >60 mL/min (ref >60)
GLUCOSE: 132 mg/dL — AB (ref 65–99)
POTASSIUM: 4.2 mmol/L (ref 3.5–5.1)
Sodium: 133 mmol/L — ABNORMAL LOW (ref 135–145)
TOTAL PROTEIN: 5.1 g/dL — AB (ref 6.5–8.1)

## 2017-03-21 LAB — BLOOD CULTURE ID PANEL (REFLEXED)
ACINETOBACTER BAUMANNII: NOT DETECTED
CANDIDA PARAPSILOSIS: NOT DETECTED
Candida albicans: NOT DETECTED
Candida glabrata: NOT DETECTED
Candida krusei: NOT DETECTED
Candida tropicalis: NOT DETECTED
ENTEROCOCCUS SPECIES: NOT DETECTED
Enterobacter cloacae complex: NOT DETECTED
Enterobacteriaceae species: NOT DETECTED
Escherichia coli: NOT DETECTED
HAEMOPHILUS INFLUENZAE: NOT DETECTED
Klebsiella oxytoca: NOT DETECTED
Klebsiella pneumoniae: NOT DETECTED
LISTERIA MONOCYTOGENES: NOT DETECTED
METHICILLIN RESISTANCE: DETECTED — AB
Neisseria meningitidis: NOT DETECTED
PROTEUS SPECIES: NOT DETECTED
PSEUDOMONAS AERUGINOSA: NOT DETECTED
STAPHYLOCOCCUS SPECIES: DETECTED — AB
STREPTOCOCCUS AGALACTIAE: NOT DETECTED
STREPTOCOCCUS PNEUMONIAE: NOT DETECTED
Serratia marcescens: NOT DETECTED
Staphylococcus aureus (BCID): NOT DETECTED
Streptococcus pyogenes: NOT DETECTED
Streptococcus species: NOT DETECTED

## 2017-03-21 LAB — ECHOCARDIOGRAM COMPLETE
Height: 66 in
Weight: 1840 oz

## 2017-03-21 LAB — HIV ANTIBODY (ROUTINE TESTING W REFLEX): HIV Screen 4th Generation wRfx: NONREACTIVE

## 2017-03-21 LAB — PREALBUMIN: Prealbumin: 5.9 mg/dL — ABNORMAL LOW (ref 18–38)

## 2017-03-21 LAB — STREP PNEUMONIAE URINARY ANTIGEN: Strep Pneumo Urinary Antigen: NEGATIVE

## 2017-03-21 LAB — HEMOGLOBIN A1C
HEMOGLOBIN A1C: 5.6 % (ref 4.8–5.6)
Mean Plasma Glucose: 114.02 mg/dL

## 2017-03-21 LAB — CK: CK TOTAL: 48 U/L (ref 38–234)

## 2017-03-21 MED ORDER — ACETAMINOPHEN 650 MG RE SUPP: 650.0000 mg | RECTAL | Status: DC | PRN

## 2017-03-21 MED ORDER — PRAVASTATIN SODIUM 20 MG PO TABS
20.0000 mg | ORAL_TABLET | Freq: Every day | ORAL | Status: DC
  Administered 2017-03-22 – 2017-03-24 (×3): 20 mg via ORAL
  Filled 2017-03-21 (×3): qty 1

## 2017-03-21 MED ORDER — DEXTROSE 5 % IV SOLN
1.0000 g | INTRAVENOUS | Status: DC
  Administered 2017-03-21 – 2017-03-24 (×4): 1 g via INTRAVENOUS
  Filled 2017-03-21 (×4): qty 10

## 2017-03-21 MED ORDER — ENOXAPARIN SODIUM 40 MG/0.4ML ~~LOC~~ SOLN
40.0000 mg | SUBCUTANEOUS | Status: DC
  Filled 2017-03-21: qty 0.4

## 2017-03-21 MED ORDER — PROPRANOLOL HCL 40 MG PO TABS
80.0000 mg | ORAL_TABLET | Freq: Two times a day (BID) | ORAL | Status: DC
Start: 2017-03-21 — End: 2017-03-21
  Administered 2017-03-21: 80 mg via ORAL
  Filled 2017-03-21: qty 1

## 2017-03-21 MED ORDER — SODIUM CHLORIDE 0.9 % IV BOLUS (SEPSIS)
500.0000 mL | Freq: Once | INTRAVENOUS | Status: AC
  Administered 2017-03-21: 500 mL via INTRAVENOUS

## 2017-03-21 MED ORDER — ACETAMINOPHEN 325 MG PO TABS: 650.0000 mg | ORAL_TABLET | ORAL | Status: DC | PRN

## 2017-03-21 MED ORDER — DEXTROSE 5 % IV SOLN
500.0000 mg | INTRAVENOUS | Status: DC
  Administered 2017-03-21 – 2017-03-23 (×3): 500 mg via INTRAVENOUS
  Filled 2017-03-21 (×3): qty 500

## 2017-03-21 MED ORDER — PANTOPRAZOLE SODIUM 40 MG PO TBEC
40.0000 mg | DELAYED_RELEASE_TABLET | Freq: Every day | ORAL | Status: DC
  Administered 2017-03-21 – 2017-03-25 (×5): 40 mg via ORAL
  Filled 2017-03-21 (×5): qty 1

## 2017-03-21 MED ORDER — SODIUM CHLORIDE 0.9 % IV SOLN
INTRAVENOUS | Status: DC
  Administered 2017-03-21: 02:00:00 via INTRAVENOUS

## 2017-03-21 MED ORDER — ACETAMINOPHEN 160 MG/5ML PO SOLN: 650.0000 mg | ORAL | Status: DC | PRN

## 2017-03-21 MED ORDER — STROKE: EARLY STAGES OF RECOVERY BOOK
Freq: Once | Status: AC
  Administered 2017-03-21: 09:00:00
  Filled 2017-03-21: qty 1

## 2017-03-21 MED ORDER — SENNOSIDES-DOCUSATE SODIUM 8.6-50 MG PO TABS: 1.0000 | ORAL_TABLET | Freq: Every evening | ORAL | Status: DC | PRN

## 2017-03-21 MED ORDER — RIVAROXABAN 15 MG PO TABS
15.0000 mg | ORAL_TABLET | Freq: Two times a day (BID) | ORAL | Status: DC
  Administered 2017-03-21 – 2017-03-25 (×8): 15 mg via ORAL
  Filled 2017-03-21 (×8): qty 1

## 2017-03-21 MED ORDER — ASPIRIN EC 81 MG PO TBEC
81.0000 mg | DELAYED_RELEASE_TABLET | Freq: Every day | ORAL | Status: DC
  Administered 2017-03-21 – 2017-03-22 (×2): 81 mg via ORAL
  Filled 2017-03-21 (×2): qty 1

## 2017-03-21 MED ORDER — SODIUM CHLORIDE 0.9 % IV SOLN
1.0000 g | Freq: Once | INTRAVENOUS | Status: AC
  Administered 2017-03-21: 1 g via INTRAVENOUS
  Filled 2017-03-21: qty 10

## 2017-03-21 NOTE — Consult Note (Signed)
.  WOC Nurse wound consult note Reason for Consult: sacral stage I, and stage II, intertriginous toes Wound type:Pressure, and MASD Pressure Injury POA: Yes Measurement:3cm x 8cm stage I with a 1cm x 1cm x 0.1cm stage II in center at coccyx Wound WGN:FAOZbed:pink Drainage (amount, consistency, odor) scant, no odor Periwound: intact Dressing procedure/placement/frequency: I have provided nurses with orders for NS moist to dry under foam dressing on sacral wound. Orders written for Xeroform weaved between toes, and Prevalon Boots for both feet. Patient in poor nutritional state, recommend Nutritional Consult for optimal wound healing, please order if you agree. We will not follow, but will remain available to this patient, to nursing, and the medical and/or surgical teams. Please re-consult if we need to assist further.   Barnett HatterMelinda Musa Rewerts, RN-C, WTA-C Wound Treatment Associate

## 2017-03-21 NOTE — Evaluation (Signed)
Clinical/Bedside Swallow Evaluation Patient Details  Name: Allison Marshall MRN: [id number] Date of Birth: 04/29/1926  Today's Date: 03/21/2017 Time:        Past Medical History:  Past Medical History:  Diagnosis Date  . GIB (gastrointestinal bleeding)   . Parkinson disease ([facility])   . PUD (peptic ulcer disease)    Past Surgical History:  Past Surgical History:  Procedure Laterality Date  . ESOPHAGOGASTRODUODENOSCOPY    . TEE WITHOUT CARDIOVERSION N/A 03/07/2015   Procedure: TRANSESOPHAGEAL ECHOCARDIOGRAM (TEE);  Surgeon: Wendall StadePeter C Nishan, MD;  [facility];  Service: Cardiovascular;  Laterality: N/A;   HPI:    Allison HarderBertha L Weatherlyis a 84 y.o.femalewith medical history significant ofParkinson'sDz,GI bleed,PUD, history of CVA;who initially presented to the hospital for reports of a new lower abdominal mass.  Last seen in the emergency department on 1/16, for complaints of lower leg swelling diagnosed with venous stasis discharged home on doxycycline and Lasix. Patient had about 4 days of diarrhea and thereafter has not had any significant bowel movement. Since that time there has been a progressive decline. Family reports that she has not gotten out of bed and has had poor appetite. She has been trying to keep herself hydrated,but does not want much food. Family also noted a sore on her sacrum for which they have been placing cream on to have pill. Other associated symptoms include lethargy, decreased urine output, andmild productive cough with green sputum for the last 2-3 days. CT scan showing new right occipital and posterior temporal lobe infarcts.Also dx wtih CAP vs aspiration PNA with imaging showing signs suggestive of bilateral lobe PNA, question of UTI.   Assessment / Plan / Recommendation Clinical Impression    Patient presents with indication of a mild oropharyngeal dysphagia with signs of decreased airway protection with thin liquids, reduced with trials  of nectar thick. Recommend diet downgrade and MBS next date to better identify origin of difficulty and least restrictive diet.       Diet Recommendation   Dysphagia 3, nectar thick liquid meds whole in puree  MBS 03/22/17         Allison Lennartz MA, CCC-SLP [phone]                             Allison Marshall 03/21/2017,3:19 PM

## 2017-03-21 NOTE — ED Notes (Signed)
patient returned from MRI and  Placed on hospital bed-Monique,RN

## 2017-03-21 NOTE — ED Notes (Signed)
Attempted report x1. 

## 2017-03-21 NOTE — Progress Notes (Signed)
PT Cancellation Note  Patient Details Name: Allison BottomsBertha L Marshall MRN: [id number] DOB: 08/19/1926   Cancelled Treatment:    Reason Eval/Treat Not Completed: Medical issues which prohibited therapy Pt with bilateral acute DVTs and is not on anticoagulation. Will follow up as pt medically appropriate and as schedule allows.   Gladys DammeBrittany Isobelle Tuckett, PT, DPT  Acute Rehabilitation Services  Pager: [phone]   [name] 03/21/2017, 12:50 PM

## 2017-03-21 NOTE — Evaluation (Signed)
Speech Language Pathology Evaluation Patient Details Name: Allison Marshall MRN: [id number] DOB: 1926-06-27 Today's Date: 03/21/2017 Time: 1440-1510 SLP Time Calculation (min) (ACUTE ONLY): 30 min  Problem List:  Patient Active Problem List   Diagnosis Date Noted  . CVA (cerebral vascular accident) ([facility]) 03/21/2017  . Urinary obstruction 03/21/2017  . Leukocytosis 03/21/2017  . Community acquired pneumonia 03/21/2017  . Hyperkalemia 03/21/2017  . Pressure ulcer of sacrum 03/21/2017  . Difficulty speaking 03/02/2015  . PUD (peptic ulcer disease)   . Cerebral infarction due to unspecified mechanism   . Parkinson disease [facility])    Past Medical History:  Past Medical History:  Diagnosis Date  . GIB (gastrointestinal bleeding)   . Parkinson disease ([facility])   . PUD (peptic ulcer disease)    Past Surgical History:  Past Surgical History:  Procedure Laterality Date  . ESOPHAGOGASTRODUODENOSCOPY    . TEE WITHOUT CARDIOVERSION N/A 03/07/2015   Procedure: TRANSESOPHAGEAL ECHOCARDIOGRAM (TEE);  Surgeon: Wendall Stade, MD;  Location: [facility] ENDOSCOPY;  Service: Cardiovascular;  Laterality: N/A;   HPI:  Allison Marshall a 84 y.o.femalewith medical history significant ofParkinson'sDz,GI bleed,PUD, history of CVA;who initially presented to the hospital for reports of a new lower abdominal mass.  Last seen in the emergency department on 1/16, for complaints of lower leg swelling diagnosed with venous stasis discharged home on doxycycline and Lasix. Patient had about 4 days of diarrhea and thereafter has not had any significant bowel movement. Since that time there has been a progressive decline. Family reports that she has not gotten out of bed and has had poor appetite. She has been trying to keep herself hydrated,but does not want much food. Family also noted a sore on her sacrum for which they have been placing cream on to have pill. Other associated symptoms include lethargy,  decreased urine output, andmild productive cough with green sputum for the last 2-3 days. CT scan showing new right occipital and posterior temporal lobe infarcts.Also dx wtih CAP vs aspiration PNA with imaging showing signs suggestive of bilateral lobe PNA, question of UTI.    Assessment / Plan / Recommendation Clinical Impression  Cognitive-linguistic evaluation complete. Cognition and language intact however pateint present with decreased speech intelligibility duet o decreased vocal intensity with low volume, hoarse, and intermittently breathy phonation which patient and family report is not baseline. Patient previously living independently before becoming ill a few weeks ago. Will benefit from acute SLP services to address speech intelligibility. Would benefit from initiation of RMT to strengthen musculature for improved phonatory output when appropriate.     SLP Assessment  SLP Recommendation/Assessment: Patient needs continued Speech Lanaguage Pathology Services SLP Visit Diagnosis: Aphonia (R49.1)    Follow Up Recommendations  (TBD)    Frequency and Duration min 2x/week  2 weeks      SLP Evaluation Cognition  Overall Cognitive Status: Within Functional Limits for tasks assessed       Comprehension  Auditory Comprehension Overall Auditory Comprehension: Appears within functional limits for tasks assessed Visual Recognition/Discrimination Discrimination: Within Function Limits Reading Comprehension Reading Status: Within funtional limits    Expression Expression Primary Mode of Expression: Verbal Verbal Expression Overall Verbal Expression: Appears within functional limits for tasks assessed   Oral / Motor  Oral Motor/Sensory Function Overall Oral Motor/Sensory Function: Generalized oral weakness Motor Speech Overall Motor Speech: Impaired Respiration: Impaired Level of Impairment: Phrase Phonation: Hoarse;Breathy;Low vocal intensity Resonance: Within functional  limits Articulation: Within functional limitis Intelligibility: Intelligibility reduced Word: 75-100% accurate Phrase: 75-100%  accurate Sentence: 75-100% accurate Motor Planning: Witnin functional limits Motor Speech Errors: Aware Interfering Components: Premorbid status(although pt reports better at baseline, ? Parkinsons) Effective Techniques: Increased vocal intensity(Lower frequency)   GO            Allison LangoLeah Caydan Mctavish MA, CCC-SLP [phone]         Allison Marshall 03/21/2017, 3:25 PM

## 2017-03-21 NOTE — ED Notes (Signed)
Patient transported to MRI 

## 2017-03-21 NOTE — Progress Notes (Signed)
PROGRESS NOTE    Allison Marshall  ZOX:[id number] DOB: 08-14-1926 DOA: 03/20/2017 PCP: Patient, No Pcp Per    Brief Narrative: 84 y.o. female with medical history significant of Parkinson's Dz, GI bleed, PUD, history of CVA; who initially presented to the hospital for reports of a new lower abdominal mass.  Assessment & Plan:   Principal Problem:   CVA (cerebral vascular accident) [facility]) - neurology on board and patient undergoing routine stroke work up - Pt is positive for DVT's stared on xarelto  DVT on u/s - start on xarelto discussed with pharmacy  Active Problems:   Parkinson disease (HCC) -  Again neuro on board    Urinary obstruction   Leukocytosis    Community acquired pneumonia - Pt is on Azithromycin and Rocephin    Hyperkalemia - Serum K is 4.2    Pressure ulcer of sacrum - routine care per nursing   DVT prophylaxis: started on xarelto Code Status: DNR Family Communication:  None at bedside. Disposition Plan: Pending improvement in condition   Consultants:   none   Procedures: none   Antimicrobials: Azithromycin, Rocephin   Subjective: Pt has no new complaints reported to me.  Objective: Vitals:   03/21/17 0500 03/21/17 0700 03/21/17 0910 03/21/17 1422  BP: (!) 105/50 (!) 118/97 (!) 119/55 (!) 101/45  Pulse: 64 68 63 66  Resp: 18 17 16 18   Temp:   99.2 F (37.3 C) 98.6 F (37 C)  TempSrc:   Oral Axillary  SpO2: 97% 99% 91% 99%  Weight:      Height:        Intake/Output Summary (Last 24 hours) at 03/21/2017 1619 Last data filed at 03/21/2017 1453 Gross per 24 hour  Intake 1130 ml  Output 2100 ml  Net -970 ml   Filed Weights   03/20/17 1919  Weight: 52.2 kg (115 lb)    Examination:  General exam: Appears calm and comfortable, in nad. Respiratory system: Clear to auscultation. Respiratory effort normal. Equal chest rise. Cardiovascular system: S1 & S2 heard, RRR. No JVD, murmurs Gastrointestinal system: Abdomen is  nondistended, soft and nontender. No organomegaly or masses felt. Normal bowel sounds heard. Central nervous system: Alert and awake. Answers questions appropriately Extremities: warm no deformities Skin: No rashes, lesions or ulcers Psychiatry:  Mood & affect appropriate.     Data Reviewed: I have personally reviewed following labs and imaging studies  CBC: Recent Labs  Lab 03/20/17 2011 03/20/17 2136 03/21/17 0206  WBC 11.1*  --  9.7  NEUTROABS 8.3*  --  7.3  HGB 15.3* 14.6 13.6  HCT 45.2 43.0 40.7  MCV 93.8  --  94.0  PLT 139*  --  116*   Basic Metabolic Panel: Recent Labs  Lab 03/20/17 2011 03/20/17 2136 03/21/17 0206  NA 134* 134* 133*  K 6.2* 4.8 4.2  CL 98* 101 99*  CO2 23  --  23  GLUCOSE 88 84 132*  BUN 33* 34* 28*  CREATININE 0.87 0.80 0.79  CALCIUM 8.6*  --  8.1*   GFR: Estimated Creatinine Clearance: 40.1 mL/min (by C-G formula based on SCr of 0.79 mg/dL). Liver Function Tests: Recent Labs  Lab 03/20/17 2011 03/21/17 0206  AST 52* 31  ALT 13* 15  ALKPHOS 64 54  BILITOT 2.2* 1.2  PROT 5.7* 5.1*  ALBUMIN 2.7* 2.4*   Recent Labs  Lab 03/20/17 2011  LIPASE 24   No results for input(s): AMMONIA in the last 168 hours. Coagulation Profile:  Recent Labs  Lab 03/20/17 2011  INR 1.00   Cardiac Enzymes: Recent Labs  Lab 03/21/17 0206  CKTOTAL 48   BNP (last 3 results) No results for input(s): PROBNP in the last 8760 hours. HbA1C: Recent Labs    03/21/17 0207  HGBA1C 5.6   CBG: No results for input(s): GLUCAP in the last 168 hours. Lipid Profile: Recent Labs    03/21/17 0206  CHOL 138  HDL 25*  LDLCALC 88  TRIG 409  CHOLHDL 5.5   Thyroid Function Tests: Recent Labs    03/20/17 2011  TSH 3.304   Anemia Panel: No results for input(s): VITAMINB12, FOLATE, FERRITIN, TIBC, IRON, RETICCTPCT in the last 72 hours. Sepsis Labs: Recent Labs  Lab 03/20/17 2019 03/20/17 2137  LATICACIDVEN 1.04 1.20    No results found for  this or any previous visit (from the past 240 hour(s)).       Radiology Studies: Dg Chest 2 View  Result Date: 03/20/2017 CLINICAL DATA:  Altered mental status. Productive cough. Not feeling well since last Thursday. Mass on the lower abdomen. EXAM: CHEST  2 VIEW COMPARISON:  03/02/2015 FINDINGS: Cardiac enlargement. Bilateral hilar enlargement, greatest on the right. This may represent prominent pulmonary arteries due to pulmonary arterial hypertension but lymphadenopathy is also possible. Suggest CT chest for further evaluation. Emphysematous changes and scattered fibrosis in the lungs. No airspace disease or consolidation. Esophageal hiatal hernia behind the heart. Blunting of the costophrenic angles suggesting bilateral effusions or thickened pleura. Calcification of the aorta. Degenerative changes in the spine. IMPRESSION: 1. Cardiac enlargement.  No vascular congestion or edema. 2. Nonspecific hilar prominence, particularly on the right. Suggest CT to exclude lymphadenopathy. 3. Emphysematous changes and chronic bronchitic changes in the lungs. No focal consolidation. 4. Aortic atherosclerosis. Electronically Signed   By: Burman Nieves M.D.   On: 03/20/2017 21:46   Ct Head Wo Contrast  Result Date: 03/20/2017 CLINICAL DATA:  Altered level of consciousness. EXAM: CT HEAD WITHOUT CONTRAST TECHNIQUE: Contiguous axial images were obtained from the base of the skull through the vertex without intravenous contrast. COMPARISON:  03/02/2015 FINDINGS: Brain: Infarct again noted in the left posterior parietal lobe as seen on prior CT and MRI. New area of low-density noted in the right occipital and posterior temporal lobes compatible with acute to subacute infarct. No hemorrhage. No hydrocephalus. Vascular: No hyperdense vessel or unexpected calcification. Skull: No acute calvarial abnormality. Sinuses/Orbits: Near complete opacification of the left sphenoid sinus. Mucosal thickening throughout the  remainder of the paranasal sinuses. Mastoids are clear. Other: None IMPRESSION: Stable left posterior parietal infarct. New acute to subacute infarct in the right occipital and posterior temporal lobes. Electronically Signed   By: Charlett Nose M.D.   On: 03/20/2017 22:14   Ct Chest W Contrast  Result Date: 03/20/2017 CLINICAL DATA:  84 y/o F; cough and chills. Mass of the lower abdomen. EXAM: CT CHEST, ABDOMEN, AND PELVIS WITH CONTRAST TECHNIQUE: Multidetector CT imaging of the chest, abdomen and pelvis was performed following the standard protocol during bolus administration of intravenous contrast. CONTRAST:  ISOVUE-300 IOPAMIDOL (ISOVUE-300) INJECTION 61% COMPARISON:  None. FINDINGS: CT CHEST FINDINGS Cardiovascular: Mild cardiomegaly. Mitral annular calcification. Normal caliber thoracic aorta with mild calcific atherosclerosis. Mildly enlarged main pulmonary artery measuring 3.6 cm. Mediastinum/Nodes: 11 mm nodule within the left lobe of thyroid. No mediastinal adenopathy. Mildly patulous thoracic esophagus. Large hiatal hernia with a large portion of stomach contained in the lower mediastinum. Lungs/Pleura: Bilateral dependent lower lobe airway  debris and areas of consolidation compatible with pneumonia, possibly aspiration given distribution. Small bilateral pleural effusions. No pneumothorax. Musculoskeletal: No acute fracture. CT ABDOMEN PELVIS FINDINGS Hepatobiliary: No focal liver abnormality is seen. No gallstones, gallbladder wall thickening, or biliary dilatation. Pancreas: Unremarkable. No pancreatic ductal dilatation or surrounding inflammatory changes. Spleen: Normal in size without focal abnormality. Adrenals/Urinary Tract: Adrenal glands are unremarkable. Multiple renal cysts measuring up to 21 mm in the right kidney. Kidneys otherwise are normal, without renal calculi, focal lesion, or hydronephrosis. Bladder collapsed around Foley catheter. Stomach/Bowel: Stomach is within normal  limits. Appendix not identified, no pericecal inflammation. No evidence of bowel wall thickening, distention, or inflammatory changes. Large volume of stool in the rectal vault. Vascular/Lymphatic: Aortic atherosclerosis. No enlarged abdominal or pelvic lymph nodes. Reproductive: Status post hysterectomy. No adnexal masses. Other: No abdominal wall hernia or abnormality. No abdominopelvic ascites. Musculoskeletal: No fracture is seen. Advanced spondylosis of the lumbar spine. Multifactorial probable severe canal stenosis at the L3-4 level. IMPRESSION: 1. Bilateral dependent lower lobe airway debris and consolidation compatible with pneumonia, possibly aspiration given distribution. Small bilateral effusions. 2. Mild cardiomegaly. 3. Enlarged main pulmonary artery may represent pulmonary artery hypertension. 4. No acute process of abdomen or pelvis identified. 5. Advanced spondylosis of lumbar spine with probable multifactorial severe L3-4 canal stenosis. Electronically Signed   By: Mitzi Hansen M.D.   On: 03/20/2017 23:25   Mr Maxine Glenn Head Wo Contrast  Result Date: 03/21/2017 CLINICAL DATA:  84 y/o  F; stroke for follow-up. EXAM: MRI HEAD WITHOUT CONTRAST MRA HEAD WITHOUT CONTRAST TECHNIQUE: Multiplanar, multiecho pulse sequences of the brain and surrounding structures were obtained without intravenous contrast. Angiographic images of the head were obtained using MRA technique without contrast. COMPARISON:  03/20/2017 CT head.  03/03/2015 MRI head and MRA head. FINDINGS: MRI HEAD FINDINGS Brain: Large right PCA distribution infarct with mildly reduced diffusion, T2 FLAIR hyperintensity, and local mass effect, likely late acute/early subacute. Small foci of susceptibility hypointensity with T1 shortening are compatible with petechial hemorrhage. Partial effacement of the atria of right lateral ventricle. Several additional punctate infarcts are present within the bilateral frontal and parietal cortices  and the right cerebellum with similar diffusion, also likely late acute/early subacute. Stable chronic infarction in the left lateral parietal lobe. Stable moderate chronic microvascular ischemic changes of white matter and parenchymal volume loss of the brain. Vascular: As below. Skull and upper cervical spine: Normal marrow signal. Sinuses/Orbits: No abnormal signal of mastoid air cells. Bilateral intra-ocular lens replacement. Moderate paranasal sinus mucosal thickening and left sphenoid sinus opacification. Other: None. MRA HEAD FINDINGS Internal carotid arteries:  Patent. Anterior cerebral arteries:  Patent. Middle cerebral arteries: Patent. Anterior communicating artery: Patent. Posterior communicating arteries:  Patent. Posterior cerebral arteries:  Patent. Basilar artery:  Patent. Vertebral arteries:  Patent. No evidence of high-grade stenosis, large vessel occlusion, or aneurysm. IMPRESSION: 1. Large late acute/early subacute infarction in right PCA territory with petechial hemorrhage. Mild local mass effect. No herniation. 2. Additional punctate infarcts are present in bilateral frontal and parietal cortices as well as right cerebellar hemisphere. 3. Patent anterior and posterior intracranial circulation. No large vessel occlusion, aneurysm, or significant stenosis is identified. 4. Stable chronic microvascular ischemic changes and parenchymal volume loss of the brain. Stable small chronic infarct in left lateral parietal lobe. Electronically Signed   By: Mitzi Hansen M.D.   On: 03/21/2017 03:37   Mr Brain Wo Contrast  Result Date: 03/21/2017 CLINICAL DATA:  84 y/o  F; stroke  for follow-up. EXAM: MRI HEAD WITHOUT CONTRAST MRA HEAD WITHOUT CONTRAST TECHNIQUE: Multiplanar, multiecho pulse sequences of the brain and surrounding structures were obtained without intravenous contrast. Angiographic images of the head were obtained using MRA technique without contrast. COMPARISON:  03/20/2017 CT  head.  03/03/2015 MRI head and MRA head. FINDINGS: MRI HEAD FINDINGS Brain: Large right PCA distribution infarct with mildly reduced diffusion, T2 FLAIR hyperintensity, and local mass effect, likely late acute/early subacute. Small foci of susceptibility hypointensity with T1 shortening are compatible with petechial hemorrhage. Partial effacement of the atria of right lateral ventricle. Several additional punctate infarcts are present within the bilateral frontal and parietal cortices and the right cerebellum with similar diffusion, also likely late acute/early subacute. Stable chronic infarction in the left lateral parietal lobe. Stable moderate chronic microvascular ischemic changes of white matter and parenchymal volume loss of the brain. Vascular: As below. Skull and upper cervical spine: Normal marrow signal. Sinuses/Orbits: No abnormal signal of mastoid air cells. Bilateral intra-ocular lens replacement. Moderate paranasal sinus mucosal thickening and left sphenoid sinus opacification. Other: None. MRA HEAD FINDINGS Internal carotid arteries:  Patent. Anterior cerebral arteries:  Patent. Middle cerebral arteries: Patent. Anterior communicating artery: Patent. Posterior communicating arteries:  Patent. Posterior cerebral arteries:  Patent. Basilar artery:  Patent. Vertebral arteries:  Patent. No evidence of high-grade stenosis, large vessel occlusion, or aneurysm. IMPRESSION: 1. Large late acute/early subacute infarction in right PCA territory with petechial hemorrhage. Mild local mass effect. No herniation. 2. Additional punctate infarcts are present in bilateral frontal and parietal cortices as well as right cerebellar hemisphere. 3. Patent anterior and posterior intracranial circulation. No large vessel occlusion, aneurysm, or significant stenosis is identified. 4. Stable chronic microvascular ischemic changes and parenchymal volume loss of the brain. Stable small chronic infarct in left lateral parietal  lobe. Electronically Signed   By: Mitzi Hansen M.D.   On: 03/21/2017 03:37   Ct Abdomen Pelvis W Contrast  Result Date: 03/20/2017 CLINICAL DATA:  84 y/o F; cough and chills. Mass of the lower abdomen. EXAM: CT CHEST, ABDOMEN, AND PELVIS WITH CONTRAST TECHNIQUE: Multidetector CT imaging of the chest, abdomen and pelvis was performed following the standard protocol during bolus administration of intravenous contrast. CONTRAST:  ISOVUE-300 IOPAMIDOL (ISOVUE-300) INJECTION 61% COMPARISON:  None. FINDINGS: CT CHEST FINDINGS Cardiovascular: Mild cardiomegaly. Mitral annular calcification. Normal caliber thoracic aorta with mild calcific atherosclerosis. Mildly enlarged main pulmonary artery measuring 3.6 cm. Mediastinum/Nodes: 11 mm nodule within the left lobe of thyroid. No mediastinal adenopathy. Mildly patulous thoracic esophagus. Large hiatal hernia with a large portion of stomach contained in the lower mediastinum. Lungs/Pleura: Bilateral dependent lower lobe airway debris and areas of consolidation compatible with pneumonia, possibly aspiration given distribution. Small bilateral pleural effusions. No pneumothorax. Musculoskeletal: No acute fracture. CT ABDOMEN PELVIS FINDINGS Hepatobiliary: No focal liver abnormality is seen. No gallstones, gallbladder wall thickening, or biliary dilatation. Pancreas: Unremarkable. No pancreatic ductal dilatation or surrounding inflammatory changes. Spleen: Normal in size without focal abnormality. Adrenals/Urinary Tract: Adrenal glands are unremarkable. Multiple renal cysts measuring up to 21 mm in the right kidney. Kidneys otherwise are normal, without renal calculi, focal lesion, or hydronephrosis. Bladder collapsed around Foley catheter. Stomach/Bowel: Stomach is within normal limits. Appendix not identified, no pericecal inflammation. No evidence of bowel wall thickening, distention, or inflammatory changes. Large volume of stool in the rectal vault.  Vascular/Lymphatic: Aortic atherosclerosis. No enlarged abdominal or pelvic lymph nodes. Reproductive: Status post hysterectomy. No adnexal masses. Other: No abdominal wall hernia or  abnormality. No abdominopelvic ascites. Musculoskeletal: No fracture is seen. Advanced spondylosis of the lumbar spine. Multifactorial probable severe canal stenosis at the L3-4 level. IMPRESSION: 1. Bilateral dependent lower lobe airway debris and consolidation compatible with pneumonia, possibly aspiration given distribution. Small bilateral effusions. 2. Mild cardiomegaly. 3. Enlarged main pulmonary artery may represent pulmonary artery hypertension. 4. No acute process of abdomen or pelvis identified. 5. Advanced spondylosis of lumbar spine with probable multifactorial severe L3-4 canal stenosis. Electronically Signed   By: Mitzi HansenLance  Furusawa-Stratton M.D.   On: 03/20/2017 23:25        Scheduled Meds: . aspirin EC  81 mg Oral Daily  . pantoprazole  40 mg Oral Daily  . Rivaroxaban  15 mg Oral BID WC   Continuous Infusions: . azithromycin    . cefTRIAXone (ROCEPHIN)  IV       LOS: 0 days    Time spent: > 35 minutes    Penny Piarlando Kiala Faraj, MD [facility] Pager [phone]  If 7PM-7AM, please contact night-coverage [email or web address] Password [email or web address] 03/21/2017, 4:19 PM

## 2017-03-21 NOTE — Progress Notes (Signed)
  Echocardiogram 2D Echocardiogram has been performed.  Delcie RochENNINGTON, Auren Valdes 03/21/2017, 5:58 PM

## 2017-03-21 NOTE — Progress Notes (Signed)
STROKE TEAM PROGRESS NOTE   SUBJECTIVE (INTERVAL HISTORY) Her daughter is at the bedside.  Daughter recounted HPI with me.  Patient lost her husband in 2008.  Had GI bleeding due to bleeding ulcer in 2011, since then, patient had gradual weight loss from 220 pounds to now 150 pounds.  She had stroke in 2017 with speech difficulty however recovered well.  During a stroke workup she was found to have large PFO but reserved EF.  She had tremor and was diagnosed with PD, not responding to Sinemet.  However recently put on propanolol seems improved some.  Normally, patient lives by herself with her daughter lives nearby.  She can walk with cane.  For the last 2 weeks patient had gradual decline, initially able to walk with cane, later on can only walk with walker but then not able to walk at all, with decreased appetite and stopped eating.  She came to ED on 03/06/17 with bilateral lower extremity swelling, was given Lasix for 3 days which helped little bit for the swelling but did not resolve.  On this admission, she was found to have a pneumonia, UTI, decubitus ulcer, and was put on antibiotics.  OBJECTIVE Temp:  [98.6 F (37 C)-99.2 F (37.3 C)] 98.7 F (37.1 C) (01/31 1807) Pulse Rate:  [63-74] 69 (01/31 1807) Cardiac Rhythm: Normal sinus rhythm;Heart block (01/31 0920) Resp:  [16-25] 16 (01/31 1807) BP: (93-128)/(45-97) 111/61 (01/31 1807) SpO2:  [91 %-100 %] 99 % (01/31 1807)  No results for input(s): GLUCAP in the last 168 hours. Recent Labs  Lab 03/20/17 2011 03/20/17 2136 03/21/17 0206  NA 134* 134* 133*  K 6.2* 4.8 4.2  CL 98* 101 99*  CO2 23  --  23  GLUCOSE 88 84 132*  BUN 33* 34* 28*  CREATININE 0.87 0.80 0.79  CALCIUM 8.6*  --  8.1*   Recent Labs  Lab 03/20/17 2011 03/21/17 0206  AST 52* 31  ALT 13* 15  ALKPHOS 64 54  BILITOT 2.2* 1.2  PROT 5.7* 5.1*  ALBUMIN 2.7* 2.4*   Recent Labs  Lab 03/20/17 2011 03/20/17 2136 03/21/17 0206  WBC 11.1*  --  9.7   NEUTROABS 8.3*  --  7.3  HGB 15.3* 14.6 13.6  HCT 45.2 43.0 40.7  MCV 93.8  --  94.0  PLT 139*  --  116*   Recent Labs  Lab 03/21/17 0206  CKTOTAL 48   Recent Labs    03/20/17 2011  LABPROT 13.1  INR 1.00   Recent Labs    03/20/17 2114 03/20/17 2250  COLORURINE YELLOW BROWN*  LABSPEC 1.014 1.025  PHURINE 5.0 6.5  GLUCOSEU NEGATIVE NEGATIVE  HGBUR LARGE* LARGE*  BILIRUBINUR NEGATIVE MODERATE*  KETONESUR NEGATIVE 15*  PROTEINUR NEGATIVE 100*  NITRITE NEGATIVE POSITIVE*  LEUKOCYTESUR NEGATIVE MODERATE*       Component Value Date/Time   CHOL 138 03/21/2017 0206   TRIG 125 03/21/2017 0206   HDL 25 (L) 03/21/2017 0206   CHOLHDL 5.5 03/21/2017 0206   VLDL 25 03/21/2017 0206   LDLCALC 88 03/21/2017 0206   Lab Results  Component Value Date   HGBA1C 5.6 03/21/2017   No results found for: LABOPIA, COCAINSCRNUR, LABBENZ, AMPHETMU, THCU, LABBARB  No results for input(s): ETH in the last 168 hours.  I have personally reviewed the radiological images below and agree with the radiology interpretations.  Dg Chest 2 View  Result Date: 03/20/2017 CLINICAL DATA:  Altered mental status. Productive cough. Not feeling well since  last Thursday. Mass on the lower abdomen. EXAM: CHEST  2 VIEW COMPARISON:  03/02/2015 FINDINGS: Cardiac enlargement. Bilateral hilar enlargement, greatest on the right. This may represent prominent pulmonary arteries due to pulmonary arterial hypertension but lymphadenopathy is also possible. Suggest CT chest for further evaluation. Emphysematous changes and scattered fibrosis in the lungs. No airspace disease or consolidation. Esophageal hiatal hernia behind the heart. Blunting of the costophrenic angles suggesting bilateral effusions or thickened pleura. Calcification of the aorta. Degenerative changes in the spine. IMPRESSION: 1. Cardiac enlargement.  No vascular congestion or edema. 2. Nonspecific hilar prominence, particularly on the right. Suggest CT to  exclude lymphadenopathy. 3. Emphysematous changes and chronic bronchitic changes in the lungs. No focal consolidation. 4. Aortic atherosclerosis. Electronically Signed   By: Burman Nieves M.D.   On: 03/20/2017 21:46   Ct Head Wo Contrast  Result Date: 03/20/2017 CLINICAL DATA:  Altered level of consciousness. EXAM: CT HEAD WITHOUT CONTRAST TECHNIQUE: Contiguous axial images were obtained from the base of the skull through the vertex without intravenous contrast. COMPARISON:  03/02/2015 FINDINGS: Brain: Infarct again noted in the left posterior parietal lobe as seen on prior CT and MRI. New area of low-density noted in the right occipital and posterior temporal lobes compatible with acute to subacute infarct. No hemorrhage. No hydrocephalus. Vascular: No hyperdense vessel or unexpected calcification. Skull: No acute calvarial abnormality. Sinuses/Orbits: Near complete opacification of the left sphenoid sinus. Mucosal thickening throughout the remainder of the paranasal sinuses. Mastoids are clear. Other: None IMPRESSION: Stable left posterior parietal infarct. New acute to subacute infarct in the right occipital and posterior temporal lobes. Electronically Signed   By: Charlett Nose M.D.   On: 03/20/2017 22:14   Ct Chest W Contrast  Result Date: 03/20/2017 CLINICAL DATA:  84 y/o F; cough and chills. Mass of the lower abdomen. EXAM: CT CHEST, ABDOMEN, AND PELVIS WITH CONTRAST TECHNIQUE: Multidetector CT imaging of the chest, abdomen and pelvis was performed following the standard protocol during bolus administration of intravenous contrast. CONTRAST:  ISOVUE-300 IOPAMIDOL (ISOVUE-300) INJECTION 61% COMPARISON:  None. FINDINGS: CT CHEST FINDINGS Cardiovascular: Mild cardiomegaly. Mitral annular calcification. Normal caliber thoracic aorta with mild calcific atherosclerosis. Mildly enlarged main pulmonary artery measuring 3.6 cm. Mediastinum/Nodes: 11 mm nodule within the left lobe of thyroid. No  mediastinal adenopathy. Mildly patulous thoracic esophagus. Large hiatal hernia with a large portion of stomach contained in the lower mediastinum. Lungs/Pleura: Bilateral dependent lower lobe airway debris and areas of consolidation compatible with pneumonia, possibly aspiration given distribution. Small bilateral pleural effusions. No pneumothorax. Musculoskeletal: No acute fracture. CT ABDOMEN PELVIS FINDINGS Hepatobiliary: No focal liver abnormality is seen. No gallstones, gallbladder wall thickening, or biliary dilatation. Pancreas: Unremarkable. No pancreatic ductal dilatation or surrounding inflammatory changes. Spleen: Normal in size without focal abnormality. Adrenals/Urinary Tract: Adrenal glands are unremarkable. Multiple renal cysts measuring up to 21 mm in the right kidney. Kidneys otherwise are normal, without renal calculi, focal lesion, or hydronephrosis. Bladder collapsed around Foley catheter. Stomach/Bowel: Stomach is within normal limits. Appendix not identified, no pericecal inflammation. No evidence of bowel wall thickening, distention, or inflammatory changes. Large volume of stool in the rectal vault. Vascular/Lymphatic: Aortic atherosclerosis. No enlarged abdominal or pelvic lymph nodes. Reproductive: Status post hysterectomy. No adnexal masses. Other: No abdominal wall hernia or abnormality. No abdominopelvic ascites. Musculoskeletal: No fracture is seen. Advanced spondylosis of the lumbar spine. Multifactorial probable severe canal stenosis at the L3-4 level. IMPRESSION: 1. Bilateral dependent lower lobe airway debris and consolidation  compatible with pneumonia, possibly aspiration given distribution. Small bilateral effusions. 2. Mild cardiomegaly. 3. Enlarged main pulmonary artery may represent pulmonary artery hypertension. 4. No acute process of abdomen or pelvis identified. 5. Advanced spondylosis of lumbar spine with probable multifactorial severe L3-4 canal stenosis. Electronically  Signed   By: Mitzi Hansen M.D.   On: 03/20/2017 23:25   Mr Maxine Glenn Head Wo Contrast  Result Date: 03/21/2017 CLINICAL DATA:  84 y/o  F; stroke for follow-up. EXAM: MRI HEAD WITHOUT CONTRAST MRA HEAD WITHOUT CONTRAST TECHNIQUE: Multiplanar, multiecho pulse sequences of the brain and surrounding structures were obtained without intravenous contrast. Angiographic images of the head were obtained using MRA technique without contrast. COMPARISON:  03/20/2017 CT head.  03/03/2015 MRI head and MRA head. FINDINGS: MRI HEAD FINDINGS Brain: Large right PCA distribution infarct with mildly reduced diffusion, T2 FLAIR hyperintensity, and local mass effect, likely late acute/early subacute. Small foci of susceptibility hypointensity with T1 shortening are compatible with petechial hemorrhage. Partial effacement of the atria of right lateral ventricle. Several additional punctate infarcts are present within the bilateral frontal and parietal cortices and the right cerebellum with similar diffusion, also likely late acute/early subacute. Stable chronic infarction in the left lateral parietal lobe. Stable moderate chronic microvascular ischemic changes of white matter and parenchymal volume loss of the brain. Vascular: As below. Skull and upper cervical spine: Normal marrow signal. Sinuses/Orbits: No abnormal signal of mastoid air cells. Bilateral intra-ocular lens replacement. Moderate paranasal sinus mucosal thickening and left sphenoid sinus opacification. Other: None. MRA HEAD FINDINGS Internal carotid arteries:  Patent. Anterior cerebral arteries:  Patent. Middle cerebral arteries: Patent. Anterior communicating artery: Patent. Posterior communicating arteries:  Patent. Posterior cerebral arteries:  Patent. Basilar artery:  Patent. Vertebral arteries:  Patent. No evidence of high-grade stenosis, large vessel occlusion, or aneurysm. IMPRESSION: 1. Large late acute/early subacute infarction in right PCA territory  with petechial hemorrhage. Mild local mass effect. No herniation. 2. Additional punctate infarcts are present in bilateral frontal and parietal cortices as well as right cerebellar hemisphere. 3. Patent anterior and posterior intracranial circulation. No large vessel occlusion, aneurysm, or significant stenosis is identified. 4. Stable chronic microvascular ischemic changes and parenchymal volume loss of the brain. Stable small chronic infarct in left lateral parietal lobe. Electronically Signed   By: Mitzi Hansen M.D.   On: 03/21/2017 03:37   Mr Brain Wo Contrast  Result Date: 03/21/2017 CLINICAL DATA:  84 y/o  F; stroke for follow-up. EXAM: MRI HEAD WITHOUT CONTRAST MRA HEAD WITHOUT CONTRAST TECHNIQUE: Multiplanar, multiecho pulse sequences of the brain and surrounding structures were obtained without intravenous contrast. Angiographic images of the head were obtained using MRA technique without contrast. COMPARISON:  03/20/2017 CT head.  03/03/2015 MRI head and MRA head. FINDINGS: MRI HEAD FINDINGS Brain: Large right PCA distribution infarct with mildly reduced diffusion, T2 FLAIR hyperintensity, and local mass effect, likely late acute/early subacute. Small foci of susceptibility hypointensity with T1 shortening are compatible with petechial hemorrhage. Partial effacement of the atria of right lateral ventricle. Several additional punctate infarcts are present within the bilateral frontal and parietal cortices and the right cerebellum with similar diffusion, also likely late acute/early subacute. Stable chronic infarction in the left lateral parietal lobe. Stable moderate chronic microvascular ischemic changes of white matter and parenchymal volume loss of the brain. Vascular: As below. Skull and upper cervical spine: Normal marrow signal. Sinuses/Orbits: No abnormal signal of mastoid air cells. Bilateral intra-ocular lens replacement. Moderate paranasal sinus mucosal thickening and left sphenoid  sinus opacification. Other: None. MRA HEAD FINDINGS Internal carotid arteries:  Patent. Anterior cerebral arteries:  Patent. Middle cerebral arteries: Patent. Anterior communicating artery: Patent. Posterior communicating arteries:  Patent. Posterior cerebral arteries:  Patent. Basilar artery:  Patent. Vertebral arteries:  Patent. No evidence of high-grade stenosis, large vessel occlusion, or aneurysm. IMPRESSION: 1. Large late acute/early subacute infarction in right PCA territory with petechial hemorrhage. Mild local mass effect. No herniation. 2. Additional punctate infarcts are present in bilateral frontal and parietal cortices as well as right cerebellar hemisphere. 3. Patent anterior and posterior intracranial circulation. No large vessel occlusion, aneurysm, or significant stenosis is identified. 4. Stable chronic microvascular ischemic changes and parenchymal volume loss of the brain. Stable small chronic infarct in left lateral parietal lobe. Electronically Signed   By: Mitzi Hansen M.D.   On: 03/21/2017 03:37   Ct Abdomen Pelvis W Contrast  Result Date: 03/20/2017 CLINICAL DATA:  84 y/o F; cough and chills. Mass of the lower abdomen. EXAM: CT CHEST, ABDOMEN, AND PELVIS WITH CONTRAST TECHNIQUE: Multidetector CT imaging of the chest, abdomen and pelvis was performed following the standard protocol during bolus administration of intravenous contrast. CONTRAST:  ISOVUE-300 IOPAMIDOL (ISOVUE-300) INJECTION 61% COMPARISON:  None. FINDINGS: CT CHEST FINDINGS Cardiovascular: Mild cardiomegaly. Mitral annular calcification. Normal caliber thoracic aorta with mild calcific atherosclerosis. Mildly enlarged main pulmonary artery measuring 3.6 cm. Mediastinum/Nodes: 11 mm nodule within the left lobe of thyroid. No mediastinal adenopathy. Mildly patulous thoracic esophagus. Large hiatal hernia with a large portion of stomach contained in the lower mediastinum. Lungs/Pleura: Bilateral dependent  lower lobe airway debris and areas of consolidation compatible with pneumonia, possibly aspiration given distribution. Small bilateral pleural effusions. No pneumothorax. Musculoskeletal: No acute fracture. CT ABDOMEN PELVIS FINDINGS Hepatobiliary: No focal liver abnormality is seen. No gallstones, gallbladder wall thickening, or biliary dilatation. Pancreas: Unremarkable. No pancreatic ductal dilatation or surrounding inflammatory changes. Spleen: Normal in size without focal abnormality. Adrenals/Urinary Tract: Adrenal glands are unremarkable. Multiple renal cysts measuring up to 21 mm in the right kidney. Kidneys otherwise are normal, without renal calculi, focal lesion, or hydronephrosis. Bladder collapsed around Foley catheter. Stomach/Bowel: Stomach is within normal limits. Appendix not identified, no pericecal inflammation. No evidence of bowel wall thickening, distention, or inflammatory changes. Large volume of stool in the rectal vault. Vascular/Lymphatic: Aortic atherosclerosis. No enlarged abdominal or pelvic lymph nodes. Reproductive: Status post hysterectomy. No adnexal masses. Other: No abdominal wall hernia or abnormality. No abdominopelvic ascites. Musculoskeletal: No fracture is seen. Advanced spondylosis of the lumbar spine. Multifactorial probable severe canal stenosis at the L3-4 level. IMPRESSION: 1. Bilateral dependent lower lobe airway debris and consolidation compatible with pneumonia, possibly aspiration given distribution. Small bilateral effusions. 2. Mild cardiomegaly. 3. Enlarged main pulmonary artery may represent pulmonary artery hypertension. 4. No acute process of abdomen or pelvis identified. 5. Advanced spondylosis of lumbar spine with probable multifactorial severe L3-4 canal stenosis. Electronically Signed   By: Mitzi Hansen M.D.   On: 03/20/2017 23:25    Carotid Doppler  Right Carotid: The extracranial vessels were near-normal with only minimal wall         thickening or plaque. Left Carotid: The extracranial vessels were near-normal with only minimal wall       thickening or plaque. Vertebrals: Both vertebral arteries were patent with antegrade flow. Subclavians: Normal flow hemodynamics were seen in bilateral subclavian       arteries.  TTE  - Left ventricle: The cavity size was normal. Wall thickness was  normal. Systolic function was normal. The estimated ejection   fraction was in the range of 55% to 60%. Left ventricular   diastolic function parameters were normal. - Aortic valve: There was mild regurgitation. - Left atrium: Significant shadowing artifact in LA but cannot r/o   LA mass suggest TEE The atrium was mildly dilated. - Right ventricle: The cavity size was severely dilated. - Right atrium: The atrium was severely dilated. - Atrial septum: Septum not well seen however the basal aspect   appears aneursyms and likely large PFO or possible venosous ASD   suggest bubble study   or f/u TEE. - Tricuspid valve: There was moderate regurgitation. - Pulmonary arteries: PA peak pressure: 60 mm Hg (S).  LE venous doppler  Right: There is evidence of acute, non occlusive DVT in the Common Femoral vein, and proximal Femoral vein. Left: There is evidence of occluisve acute DVT in the Common Femoral vein, Femoral vein, proximal Profunda vein, Popliteal vein, Posterior Tibial veins, and Peroneal veins, and in the external iliac vein. The IVC is patent   PHYSICAL EXAM  Temp:  [98.6 F (37 C)-99.2 F (37.3 C)] 98.7 F (37.1 C) (01/31 1807) Pulse Rate:  [63-74] 69 (01/31 1807) Resp:  [16-25] 16 (01/31 1807) BP: (93-128)/(45-97) 111/61 (01/31 1807) SpO2:  [91 %-100 %] 99 % (01/31 1807)  General - Well nourished, well developed, lethargic.  Ophthalmologic - fundi not visualized due to noncooperation.  Cardiovascular - Regular rate and rhythm.  Mental Status -  Level of arousal and orientation to place, and  person were intact, but not orientated to time. Language exam showed paucity of speech, moderate dysarthria, able to name 2/2 and repeat sentences. Follows all simple commands  Cranial Nerves II - XII - II - Visual field intact OU. III, IV, VI - Extraocular movements intact. V - Facial sensation intact bilaterally. VII - right nasolabial fold flattening. VIII - Hearing & vestibular intact bilaterally. X - Palate elevates symmetrically. XI - Chin turning & shoulder shrug intact bilaterally. XII - Tongue protrusion intact.  Motor Strength - The patient's strength was 4/5 BUEs and 3-/5 BLEs with bilateral LE swelling and pronator drift was absent.  Bulk was normal and fasciculations were absent.   Motor Tone - Muscle tone was assessed at the neck and appendages and was normal.  Reflexes - The patient's reflexes were symmetrical in all extremities and she had no pathological reflexes.  Sensory - Light touch, temperature/pinprick were assessed and were symmetrical.    Coordination - The patient had normal movements in the hands with no ataxia or dysmetria, but slow motion.  Tremor was present at lip and tongue with mild resting tremor b/l arm.  Gait and Station - deferred.   ASSESSMENT/PLAN Ms. Peyton BottomsBertha L Maresh is a 84 y.o. female with history of tremors, GI bleeding in 2011, stroke 2017, gradual weight loss admitted for progressive decline, leg swelling in 2 weeks. No tPA given due to out of window.    Stroke:  right MCA and PCA moderate sized, right cerebellum and left periventricular punctate infarcts embolic patter secondary to paradoxical emboli with LE DVT and large PFO.  Resultant lethargy and physical decline  MRI right PCA and right inferior MCA acute infarct, right cerebellum, left periventricular white matter punctate infarcts, and left MCA remote infarcts  MRA  Head negative  Carotid Doppler  negative  2D Echo EF 55-60%  LE venous doppler - bilateral LE acute  DVT  LDL 88  HgbA1c  5.6  Xarelto for VTE prophylaxis  Fall precautions  Aspiration precautions  DIET DYS 3 Room service appropriate? Yes; Fluid consistency: Nectar Thick   aspirin 81 mg daily prior to admission, now on Xarelto (rivaroxaban) bid for DVT and stroke treatment.   Patient counseled to be compliant with her antithrombotic medications  Ongoing aggressive stroke risk factor management  Therapy recommendations:  pending  Disposition:  Pending  PFO  Large PFO reviewed in 02/2015 with TEE during stroke workup  Current stroke likely due to DVT in the setting of PFO  Bilateral DVT  Confirmed on LE venous Doppler  Consistent with history of bilateral lower extremity edema  On Xarelto for treatment  History of stroke  02/2015 - speech difficulty - left MCA infarct, negative DVT, carotid Doppler negative, EF 60-65% but found to have abnormal mobile linear density on the aortic side of the aortic valve, TEE showed large PFO, EF 60, mild AR with small lambl's excresence.   Hyperlipidemia  Home meds:  Pravastatin not taking   LDL 88, goal < 70  Now on pravastatin 20  Continue statin at discharge  Other Stroke Risk Factors  Advanced age  Other Active Problems  Pneumonia - on azithromycin  UTI - on rocephin  Tremor - was on propranolol 80mg  at home but recommend to hold off due to low BP   Hospital day # 0    Marvel Plan, MD PhD Stroke Neurology 03/21/2017 8:23 PM    To contact Stroke Continuity provider, please refer to [email or web address]. After hours, contact General Neurology

## 2017-03-21 NOTE — Progress Notes (Signed)
SLP Cancellation Note  Patient Details Name: Peyton BottomsBertha L Antonio MRN: [id number] DOB: 02/12/1927   Cancelled treatment:       Reason Eval/Treat Not Completed: Patient at procedure or test/unavailable  Ferdinand LangoLeah Ranvir Renovato MA, CCC-SLP [phone]  Clessie Karras Meryl 03/21/2017, 11:20 AM

## 2017-03-21 NOTE — ED Notes (Signed)
Neurologist at bedside-Monique,RN  

## 2017-03-21 NOTE — Progress Notes (Signed)
VASCULAR LAB PRELIMINARY  PRELIMINARY  PRELIMINARY  PRELIMINARY  Carotid duplex completed.    Preliminary report:  1-39% ICA plaquing.  Vertebral artery flow is antegrade.   Louetta Hollingshead, RVT 03/21/2017, 11:58 AM

## 2017-03-21 NOTE — Progress Notes (Signed)
Pharmacy Antibiotic Note  Allison Marshall is a 84 y.o. female admitted on 03/20/2017 with pneumonia.  Pharmacy has been consulted for Ceftriaxone dosing. WBC 11.1 CT with likely PNA.   Plan: Ceftriaxone 1g IV q24h Azithromycin per MD Trend WBC, temp F/U infectious work-up  Height: 5\' 6"  (167.6 cm) Weight: 115 lb (52.2 kg) IBW/kg (Calculated) : 59.3  Temp (24hrs), Avg:98.7 F (37.1 C), Min:98.7 F (37.1 C), Max:98.7 F (37.1 C)  Recent Labs  Lab 03/20/17 2011 03/20/17 2019 03/20/17 2136 03/20/17 2137  WBC 11.1*  --   --   --   CREATININE 0.87  --  0.80  --   LATICACIDVEN  --  1.04  --  1.20    Estimated Creatinine Clearance: 40.1 mL/min (by C-G formula based on SCr of 0.8 mg/dL).    Allergies  Allergen Reactions  . Influenza Vac Split [Flu Virus Vaccine] Other (See Comments)    Family reports that patient had paralysis with flu vaccine     Abran DukeLedford, Saketh Daubert 03/21/2017 1:10 AM

## 2017-03-21 NOTE — Progress Notes (Addendum)
VASCULAR LAB PRELIMINARY  PRELIMINARY  PRELIMINARY  PRELIMINARY  Bilateral lower extremity venous duplex completed.    Preliminary report:  There is acute, non occlusive DVT noted in the right common femoral and proximal femoral veins.  There is acute, occlusive DVT noted in the left posterior tibial, popliteal, femoral, common femoral, and external iliac veins.  The right iliac and the IVC are patent.  Jidenna Figgs, RVT 03/21/2017, 11:47 AM

## 2017-03-22 ENCOUNTER — Inpatient Hospital Stay (HOSPITAL_COMMUNITY): Payer: Medicare Other

## 2017-03-22 DIAGNOSIS — N3000 Acute cystitis without hematuria: Secondary | ICD-10-CM

## 2017-03-22 DIAGNOSIS — I6340 Cerebral infarction due to embolism of unspecified cerebral artery: Secondary | ICD-10-CM

## 2017-03-22 DIAGNOSIS — A4151 Sepsis due to Escherichia coli [E. coli]: Secondary | ICD-10-CM

## 2017-03-22 DIAGNOSIS — R7881 Bacteremia: Secondary | ICD-10-CM

## 2017-03-22 LAB — URINE CULTURE
Culture: NO GROWTH
Culture: NO GROWTH

## 2017-03-22 MED ORDER — RESOURCE THICKENUP CLEAR PO POWD
ORAL | Status: DC | PRN
  Administered 2017-03-22: 22:00:00 via ORAL
  Filled 2017-03-22: qty 125

## 2017-03-22 NOTE — Discharge Instructions (Signed)
Information on my medicine - XARELTO (rivaroxaban)  This medication education was reviewed with me or my healthcare representative as part of my discharge preparation.    WHY WAS XARELTO PRESCRIBED FOR YOU? Xarelto was prescribed to treat blood clots that may have been found in the veins of your legs (deep vein thrombosis) or in your lungs (pulmonary embolism) and to reduce the risk of them occurring again.  What do you need to know about Xarelto? The starting dose is one 15 mg tablet taken TWICE daily with food for the FIRST 21 DAYS then on 04/10/2017  the dose is changed to one 20 mg tablet taken ONCE A DAY with your evening meal.  DO NOT stop taking Xarelto without talking to the health care provider who prescribed the medication.  Refill your prescription for 20 mg tablets before you run out.  After discharge, you should have regular check-up appointments with your healthcare provider that is prescribing your Xarelto.  In the future your dose may need to be changed if your kidney function changes by a significant amount.  What do you do if you miss a dose? If you are taking Xarelto TWICE DAILY and you miss a dose, take it as soon as you remember. You may take two 15 mg tablets (total 30 mg) at the same time then resume your regularly scheduled 15 mg twice daily the next day.  If you are taking Xarelto ONCE DAILY and you miss a dose, take it as soon as you remember on the same day then continue your regularly scheduled once daily regimen the next day. Do not take two doses of Xarelto at the same time.   Important Safety Information Xarelto is a blood thinner medicine that can cause bleeding. You should call your healthcare provider right away if you experience any of the following: ? Bleeding from an injury or your nose that does not stop. ? Unusual colored urine (red or dark brown) or unusual colored stools (red or black). ? Unusual bruising for unknown reasons. ? A serious fall  or if you hit your head (even if there is no bleeding).  Some medicines may interact with Xarelto and might increase your risk of bleeding while on Xarelto. To help avoid this, consult your healthcare provider or pharmacist prior to using any new prescription or non-prescription medications, including herbals, vitamins, non-steroidal anti-inflammatory drugs (NSAIDs) and supplements.  This website has more information on Xarelto: [email or web address].xarelto.[email or web address].

## 2017-03-22 NOTE — Progress Notes (Signed)
Modified Barium Swallow Progress Note  Patient Details  Name: Allison BottomsBertha L Prisco MRN: [id number] Date of Birth: 10/05/1926  Today's Date: 03/22/2017  Modified Barium Swallow completed.  Full report located under Chart Review in the Imaging Section.  Brief recommendations include the following:  Clinical Impression  Pt demonstrates a moderate oropharyngeal dysphagia characterized by horizontal positioning of pharynx due to curvature of spine with delay in swallow initiation, likely impaired from baseline age related function due to acute illnesses and deconditioning. If pt is given teaspoon size boluses, thin or thick, she is able to protect airway prior to spillage to trachea, though some silent frank penetration events do occur. Pt ejects penetrate with a cued throat clear. If pt consumes honey thick liquids, regardless of bolus size she is also able to initiate swallow prior to bolus arrival to the vestible. Pts positioning makes cup sips difficult. Pt is able to sip honey via straw, though it is not easy. There is no pharyngeal weakness observed, cough/throat clear is adequately strong if triggered. Depending on goals of care, pt may benefit from honey liquids over a short term to reduce aspiration risk while she is recovering. She could also be given teaspoons of thin water if being assisted by an attentive, trained careguver to cue for an immediate throat clear after each sip. Given family reports of decreasing oral intake and dehydration, these restriction may lead to further medical decline. Will discuss with family.    Swallow Evaluation Recommendations       SLP Diet Recommendations: Dysphagia 1 (Puree) solids;Honey thick liquids;Thin liquid(thin teaspoon with full family supervision)   Liquid Administration via: Spoon;Straw(honey - straw, thin water - teaspoon)   Medication Administration: Crushed with puree   Supervision: Full assist for feeding;Full supervision/cueing for  compensatory strategies   Compensations: Slow rate;Small sips/bites;Clear throat after each swallow   Postural Changes: Remain semi-upright after after feeds/meals (Comment)   Oral Care Recommendations: Oral care BID   Other Recommendations: Order thickener from pharmacy    Michel Eskelson, Riley NearingBonnie Caroline 03/22/2017,10:47 AM

## 2017-03-22 NOTE — Progress Notes (Signed)
PHARMACY - PHYSICIAN COMMUNICATION CRITICAL VALUE ALERT - BLOOD CULTURE IDENTIFICATION (BCID)  Allison BottomsBertha L Marshall is an 84 y.o. female [facility]   Assessment:  CVA, DVT, UTI, CAP  Name of physician (or Provider) Contacted: Audrea MuscatXenia Blount ([facility])  Current antibiotics: Ceftriaxone/Azithromycin for CAP/UTI  Changes to prescribed antibiotics recommended: Likely contaminant, cont current anti-biotics for CAP/UTI  Results for orders placed or performed during the hospital encounter of 03/20/17  Blood Culture ID Panel (Reflexed) (Collected: 03/20/2017 11:58 PM)  Result Value Ref Range   Enterococcus species NOT DETECTED NOT DETECTED   Listeria monocytogenes NOT DETECTED NOT DETECTED   Staphylococcus species DETECTED (A) NOT DETECTED   Staphylococcus aureus NOT DETECTED NOT DETECTED   Methicillin resistance DETECTED (A) NOT DETECTED   Streptococcus species NOT DETECTED NOT DETECTED   Streptococcus agalactiae NOT DETECTED NOT DETECTED   Streptococcus pneumoniae NOT DETECTED NOT DETECTED   Streptococcus pyogenes NOT DETECTED NOT DETECTED   Acinetobacter baumannii NOT DETECTED NOT DETECTED   Enterobacteriaceae species NOT DETECTED NOT DETECTED   Enterobacter cloacae complex NOT DETECTED NOT DETECTED   Escherichia coli NOT DETECTED NOT DETECTED   Klebsiella oxytoca NOT DETECTED NOT DETECTED   Klebsiella pneumoniae NOT DETECTED NOT DETECTED   Proteus species NOT DETECTED NOT DETECTED   Serratia marcescens NOT DETECTED NOT DETECTED   Haemophilus influenzae NOT DETECTED NOT DETECTED   Neisseria meningitidis NOT DETECTED NOT DETECTED   Pseudomonas aeruginosa NOT DETECTED NOT DETECTED   Candida albicans NOT DETECTED NOT DETECTED   Candida glabrata NOT DETECTED NOT DETECTED   Candida krusei NOT DETECTED NOT DETECTED   Candida parapsilosis NOT DETECTED NOT DETECTED   Candida tropicalis NOT DETECTED NOT DETECTED    Abran DukeLedford, Jamilia Jacques 03/22/2017  1:56 AM

## 2017-03-22 NOTE — Progress Notes (Signed)
PROGRESS NOTE    Allison Marshall  ZOX:[id number] DOB: 29-Dec-1926 DOA: 03/20/2017 PCP: Patient, No Pcp Per    Brief Narrative: 84 y.o. female with medical history significant of Parkinson's Dz, GI bleed, PUD, history of CVA; who initially presented to the hospital for reports of a new lower abdominal mass.  Assessment & Plan:   Principal Problem:   CVA (cerebral vascular accident) [facility]) - neurology on board and patient undergoing routine stroke work up - + dvt started on xarelto  DVT on u/s - Please see above, d/c pharmacy  Active Problems:   Parkinson disease (HCC) -  Again neuro on board    Urinary obstruction   Leukocytosis    Community acquired pneumonia - Improving on Azithromycin and Rocephin    Hyperkalemia - Resolved and wnl on last check.    Pressure ulcer of sacrum - routine care per nursing   DVT prophylaxis: started on xarelto Code Status: DNR Family Communication:  None at bedside. Disposition Plan: Pending improvement in condition   Consultants:   none   Procedures: none   Antimicrobials: Azithromycin, Rocephin   Subjective: Pt has no new complaints reported to me.  Objective: Vitals:   03/21/17 2141 03/22/17 0132 03/22/17 0601 03/22/17 1430  BP: (!) 115/59 (!) 103/55 (!) 162/86 (!) 108/58  Pulse: 72 68 86 75  Resp: 18 18 18 16   Temp: 97.7 F (36.5 C) 98 F (36.7 C) 98.4 F (36.9 C) 98 F (36.7 C)  TempSrc: Axillary Axillary Axillary Oral  SpO2: 96% 97% 96% 99%  Weight:      Height:        Intake/Output Summary (Last 24 hours) at 03/22/2017 1653 Last data filed at 03/22/2017 0827 Gross per 24 hour  Intake 780 ml  Output -  Net 780 ml   Filed Weights   03/20/17 1919  Weight: 52.2 kg (115 lb)    Examination: Exam unchanged when compared to prior on 03/21/17  General exam: Appears calm and comfortable, in nad. Respiratory system: Clear to auscultation. Respiratory effort normal. Equal chest rise. Cardiovascular system:  S1 & S2 heard, RRR. No JVD, murmurs Gastrointestinal system: Abdomen is nondistended, soft and nontender. No organomegaly or masses felt. Normal bowel sounds heard. Central nervous system: Alert and awake. Answers questions appropriately Extremities: warm no deformities Skin: No rashes, lesions or ulcers Psychiatry:  Mood & affect appropriate.     Data Reviewed: I have personally reviewed following labs and imaging studies  CBC: Recent Labs  Lab 03/20/17 2011 03/20/17 2136 03/21/17 0206  WBC 11.1*  --  9.7  NEUTROABS 8.3*  --  7.3  HGB 15.3* 14.6 13.6  HCT 45.2 43.0 40.7  MCV 93.8  --  94.0  PLT 139*  --  116*   Basic Metabolic Panel: Recent Labs  Lab 03/20/17 2011 03/20/17 2136 03/21/17 0206  NA 134* 134* 133*  K 6.2* 4.8 4.2  CL 98* 101 99*  CO2 23  --  23  GLUCOSE 88 84 132*  BUN 33* 34* 28*  CREATININE 0.87 0.80 0.79  CALCIUM 8.6*  --  8.1*   GFR: Estimated Creatinine Clearance: 40.1 mL/min (by C-G formula based on SCr of 0.79 mg/dL). Liver Function Tests: Recent Labs  Lab 03/20/17 2011 03/21/17 0206  AST 52* 31  ALT 13* 15  ALKPHOS 64 54  BILITOT 2.2* 1.2  PROT 5.7* 5.1*  ALBUMIN 2.7* 2.4*   Recent Labs  Lab 03/20/17 2011  LIPASE 24   No results  for input(s): AMMONIA in the last 168 hours. Coagulation Profile: Recent Labs  Lab 03/20/17 2011  INR 1.00   Cardiac Enzymes: Recent Labs  Lab 03/21/17 0206  CKTOTAL 48   BNP (last 3 results) No results for input(s): PROBNP in the last 8760 hours. HbA1C: Recent Labs    03/21/17 0207  HGBA1C 5.6   CBG: No results for input(s): GLUCAP in the last 168 hours. Lipid Profile: Recent Labs    03/21/17 0206  CHOL 138  HDL 25*  LDLCALC 88  TRIG 132  CHOLHDL 5.5   Thyroid Function Tests: Recent Labs    03/20/17 2011  TSH 3.304   Anemia Panel: No results for input(s): VITAMINB12, FOLATE, FERRITIN, TIBC, IRON, RETICCTPCT in the last 72 hours. Sepsis Labs: Recent Labs  Lab  03/20/17 2019 03/20/17 2137  LATICACIDVEN 1.04 1.20    Recent Results (from the past 240 hour(s))  Urine culture     Status: None   Collection Time: 03/20/17  9:00 PM  Result Value Ref Range Status   Specimen Description URINE, RANDOM  Final   Special Requests NONE  Final   Culture   Final    NO GROWTH Performed at [facility], [address], [place], [place] [address]    Report Status 03/22/2017 FINAL  Final  Urine culture     Status: None   Collection Time: 03/20/17 11:13 PM  Result Value Ref Range Status   Specimen Description URINE, CATHETERIZED  Final   Special Requests NONE  Final   Culture   Final    NO GROWTH Performed at [facility], [address] [place], [place] [address]    Report Status 03/22/2017 FINAL  Final  Blood culture (routine x 2)     Status: None (Preliminary result)   Collection Time: 03/20/17 11:58 PM  Result Value Ref Range Status   Specimen Description BLOOD RIGHT FOREARM  Final   Special Requests   Final    BOTTLES DRAWN AEROBIC AND ANAEROBIC Blood Culture adequate volume   Culture  Setup Time   Final    GRAM POSITIVE COCCI ANAEROBIC BOTTLE ONLY CRITICAL RESULT CALLED TO, READ BACK BY AND VERIFIED WITH: J [facility] 03/21/17 2340 JDW    Culture   Final    GRAM POSITIVE COCCI CULTURE REINCUBATED FOR BETTER GROWTH Performed at [facility], [address] [place], [place] [address]    Report Status PENDING  Incomplete  Blood culture (routine x 2)     Status: None (Preliminary result)   Collection Time: 03/20/17 11:58 PM  Result Value Ref Range Status   Specimen Description BLOOD RIGHT HAND  Final   Special Requests   Final    BOTTLES DRAWN AEROBIC AND ANAEROBIC Blood Culture adequate volume   Culture   Final    NO GROWTH 1 DAY Performed at [facility], [address], [place], [place] [address]    Report Status PENDING  Incomplete  Blood Culture ID Panel (Reflexed)     Status: Abnormal   Collection  Time: 03/20/17 11:58 PM  Result Value Ref Range Status   Enterococcus species NOT DETECTED NOT DETECTED Final   Listeria monocytogenes NOT DETECTED NOT DETECTED Final   Staphylococcus species DETECTED (A) NOT DETECTED Final    Comment: Methicillin (oxacillin) resistant coagulase negative staphylococcus. Possible blood culture contaminant (unless isolated from more than one blood culture draw or clinical case suggests pathogenicity). No antibiotic treatment is indicated for  blood  culture contaminants. CRITICAL RESULT CALLED TO, READ BACK BY AND VERIFIED WITH:  J [facility] PHARMD 03/21/17 2340 JDW    Staphylococcus aureus NOT DETECTED NOT DETECTED Final   Methicillin resistance DETECTED (A) NOT DETECTED Final    Comment: CRITICAL RESULT CALLED TO, READ BACK BY AND VERIFIED WITH:  J [facility] PHARMD 03/21/17 2340 JDW    Streptococcus species NOT DETECTED NOT DETECTED Final   Streptococcus agalactiae NOT DETECTED NOT DETECTED Final   Streptococcus pneumoniae NOT DETECTED NOT DETECTED Final   Streptococcus pyogenes NOT DETECTED NOT DETECTED Final   Acinetobacter baumannii NOT DETECTED NOT DETECTED Final   Enterobacteriaceae species NOT DETECTED NOT DETECTED Final   Enterobacter cloacae complex NOT DETECTED NOT DETECTED Final   Escherichia coli NOT DETECTED NOT DETECTED Final   Klebsiella oxytoca NOT DETECTED NOT DETECTED Final   Klebsiella pneumoniae NOT DETECTED NOT DETECTED Final   Proteus species NOT DETECTED NOT DETECTED Final   Serratia marcescens NOT DETECTED NOT DETECTED Final   Haemophilus influenzae NOT DETECTED NOT DETECTED Final   Neisseria meningitidis NOT DETECTED NOT DETECTED Final   Pseudomonas aeruginosa NOT DETECTED NOT DETECTED Final   Candida albicans NOT DETECTED NOT DETECTED Final   Candida glabrata NOT DETECTED NOT DETECTED Final   Candida krusei NOT DETECTED NOT DETECTED Final   Candida parapsilosis NOT DETECTED NOT DETECTED Final   Candida tropicalis NOT DETECTED NOT  DETECTED Final         Radiology Studies: Dg Chest 2 View  Result Date: 03/20/2017 CLINICAL DATA:  Altered mental status. Productive cough. Not feeling well since last Thursday. Mass on the lower abdomen. EXAM: CHEST  2 VIEW COMPARISON:  03/02/2015 FINDINGS: Cardiac enlargement. Bilateral hilar enlargement, greatest on the right. This may represent prominent pulmonary arteries due to pulmonary arterial hypertension but lymphadenopathy is also possible. Suggest CT chest for further evaluation. Emphysematous changes and scattered fibrosis in the lungs. No airspace disease or consolidation. Esophageal hiatal hernia behind the heart. Blunting of the costophrenic angles suggesting bilateral effusions or thickened pleura. Calcification of the aorta. Degenerative changes in the spine. IMPRESSION: 1. Cardiac enlargement.  No vascular congestion or edema. 2. Nonspecific hilar prominence, particularly on the right. Suggest CT to exclude lymphadenopathy. 3. Emphysematous changes and chronic bronchitic changes in the lungs. No focal consolidation. 4. Aortic atherosclerosis. Electronically Signed   By: Burman Nieves M.D.   On: 03/20/2017 21:46   Ct Head Wo Contrast  Result Date: 03/20/2017 CLINICAL DATA:  Altered level of consciousness. EXAM: CT HEAD WITHOUT CONTRAST TECHNIQUE: Contiguous axial images were obtained from the base of the skull through the vertex without intravenous contrast. COMPARISON:  03/02/2015 FINDINGS: Brain: Infarct again noted in the left posterior parietal lobe as seen on prior CT and MRI. New area of low-density noted in the right occipital and posterior temporal lobes compatible with acute to subacute infarct. No hemorrhage. No hydrocephalus. Vascular: No hyperdense vessel or unexpected calcification. Skull: No acute calvarial abnormality. Sinuses/Orbits: Near complete opacification of the left sphenoid sinus. Mucosal thickening throughout the remainder of the paranasal sinuses.  Mastoids are clear. Other: None IMPRESSION: Stable left posterior parietal infarct. New acute to subacute infarct in the right occipital and posterior temporal lobes. Electronically Signed   By: Charlett Nose M.D.   On: 03/20/2017 22:14   Ct Chest W Contrast  Result Date: 03/20/2017 CLINICAL DATA:  84 y/o F; cough and chills. Mass of the lower abdomen. EXAM: CT CHEST, ABDOMEN, AND PELVIS WITH CONTRAST TECHNIQUE: Multidetector  CT imaging of the chest, abdomen and pelvis was performed following the standard protocol during bolus administration of intravenous contrast. CONTRAST:  100mL ISOVUE-300 IOPAMIDOL (ISOVUE-300) INJECTION 61% COMPARISON:  None. FINDINGS: CT CHEST FINDINGS Cardiovascular: Mild cardiomegaly. Mitral annular calcification. Normal caliber thoracic aorta with mild calcific atherosclerosis. Mildly enlarged main pulmonary artery measuring 3.6 cm. Mediastinum/Nodes: 11 mm nodule within the left lobe of thyroid. No mediastinal adenopathy. Mildly patulous thoracic esophagus. Large hiatal hernia with a large portion of stomach contained in the lower mediastinum. Lungs/Pleura: Bilateral dependent lower lobe airway debris and areas of consolidation compatible with pneumonia, possibly aspiration given distribution. Small bilateral pleural effusions. No pneumothorax. Musculoskeletal: No acute fracture. CT ABDOMEN PELVIS FINDINGS Hepatobiliary: No focal liver abnormality is seen. No gallstones, gallbladder wall thickening, or biliary dilatation. Pancreas: Unremarkable. No pancreatic ductal dilatation or surrounding inflammatory changes. Spleen: Normal in size without focal abnormality. Adrenals/Urinary Tract: Adrenal glands are unremarkable. Multiple renal cysts measuring up to 21 mm in the right kidney. Kidneys otherwise are normal, without renal calculi, focal lesion, or hydronephrosis. Bladder collapsed around Foley catheter. Stomach/Bowel: Stomach is within normal limits. Appendix not identified, no  pericecal inflammation. No evidence of bowel wall thickening, distention, or inflammatory changes. Large volume of stool in the rectal vault. Vascular/Lymphatic: Aortic atherosclerosis. No enlarged abdominal or pelvic lymph nodes. Reproductive: Status post hysterectomy. No adnexal masses. Other: No abdominal wall hernia or abnormality. No abdominopelvic ascites. Musculoskeletal: No fracture is seen. Advanced spondylosis of the lumbar spine. Multifactorial probable severe canal stenosis at the L3-4 level. IMPRESSION: 1. Bilateral dependent lower lobe airway debris and consolidation compatible with pneumonia, possibly aspiration given distribution. Small bilateral effusions. 2. Mild cardiomegaly. 3. Enlarged main pulmonary artery may represent pulmonary artery hypertension. 4. No acute process of abdomen or pelvis identified. 5. Advanced spondylosis of lumbar spine with probable multifactorial severe L3-4 canal stenosis. Electronically Signed   By: Mitzi HansenLance  Furusawa-Stratton M.D.   On: 03/20/2017 23:25   Mr Maxine GlennMra Head Wo Contrast  Result Date: 03/21/2017 CLINICAL DATA:  84 y/o  F; stroke for follow-up. EXAM: MRI HEAD WITHOUT CONTRAST MRA HEAD WITHOUT CONTRAST TECHNIQUE: Multiplanar, multiecho pulse sequences of the brain and surrounding structures were obtained without intravenous contrast. Angiographic images of the head were obtained using MRA technique without contrast. COMPARISON:  03/20/2017 CT head.  03/03/2015 MRI head and MRA head. FINDINGS: MRI HEAD FINDINGS Brain: Large right PCA distribution infarct with mildly reduced diffusion, T2 FLAIR hyperintensity, and local mass effect, likely late acute/early subacute. Small foci of susceptibility hypointensity with T1 shortening are compatible with petechial hemorrhage. Partial effacement of the atria of right lateral ventricle. Several additional punctate infarcts are present within the bilateral frontal and parietal cortices and the right cerebellum with similar  diffusion, also likely late acute/early subacute. Stable chronic infarction in the left lateral parietal lobe. Stable moderate chronic microvascular ischemic changes of white matter and parenchymal volume loss of the brain. Vascular: As below. Skull and upper cervical spine: Normal marrow signal. Sinuses/Orbits: No abnormal signal of mastoid air cells. Bilateral intra-ocular lens replacement. Moderate paranasal sinus mucosal thickening and left sphenoid sinus opacification. Other: None. MRA HEAD FINDINGS Internal carotid arteries:  Patent. Anterior cerebral arteries:  Patent. Middle cerebral arteries: Patent. Anterior communicating artery: Patent. Posterior communicating arteries:  Patent. Posterior cerebral arteries:  Patent. Basilar artery:  Patent. Vertebral arteries:  Patent. No evidence of high-grade stenosis, large vessel occlusion, or aneurysm. IMPRESSION: 1. Large late acute/early subacute infarction in right PCA territory with petechial hemorrhage. Mild  local mass effect. No herniation. 2. Additional punctate infarcts are present in bilateral frontal and parietal cortices as well as right cerebellar hemisphere. 3. Patent anterior and posterior intracranial circulation. No large vessel occlusion, aneurysm, or significant stenosis is identified. 4. Stable chronic microvascular ischemic changes and parenchymal volume loss of the brain. Stable small chronic infarct in left lateral parietal lobe. Electronically Signed   By: Mitzi Hansen M.D.   On: 03/21/2017 03:37   Mr Brain Wo Contrast  Result Date: 03/21/2017 CLINICAL DATA:  84 y/o  F; stroke for follow-up. EXAM: MRI HEAD WITHOUT CONTRAST MRA HEAD WITHOUT CONTRAST TECHNIQUE: Multiplanar, multiecho pulse sequences of the brain and surrounding structures were obtained without intravenous contrast. Angiographic images of the head were obtained using MRA technique without contrast. COMPARISON:  03/20/2017 CT head.  03/03/2015 MRI head and MRA head.  FINDINGS: MRI HEAD FINDINGS Brain: Large right PCA distribution infarct with mildly reduced diffusion, T2 FLAIR hyperintensity, and local mass effect, likely late acute/early subacute. Small foci of susceptibility hypointensity with T1 shortening are compatible with petechial hemorrhage. Partial effacement of the atria of right lateral ventricle. Several additional punctate infarcts are present within the bilateral frontal and parietal cortices and the right cerebellum with similar diffusion, also likely late acute/early subacute. Stable chronic infarction in the left lateral parietal lobe. Stable moderate chronic microvascular ischemic changes of white matter and parenchymal volume loss of the brain. Vascular: As below. Skull and upper cervical spine: Normal marrow signal. Sinuses/Orbits: No abnormal signal of mastoid air cells. Bilateral intra-ocular lens replacement. Moderate paranasal sinus mucosal thickening and left sphenoid sinus opacification. Other: None. MRA HEAD FINDINGS Internal carotid arteries:  Patent. Anterior cerebral arteries:  Patent. Middle cerebral arteries: Patent. Anterior communicating artery: Patent. Posterior communicating arteries:  Patent. Posterior cerebral arteries:  Patent. Basilar artery:  Patent. Vertebral arteries:  Patent. No evidence of high-grade stenosis, large vessel occlusion, or aneurysm. IMPRESSION: 1. Large late acute/early subacute infarction in right PCA territory with petechial hemorrhage. Mild local mass effect. No herniation. 2. Additional punctate infarcts are present in bilateral frontal and parietal cortices as well as right cerebellar hemisphere. 3. Patent anterior and posterior intracranial circulation. No large vessel occlusion, aneurysm, or significant stenosis is identified. 4. Stable chronic microvascular ischemic changes and parenchymal volume loss of the brain. Stable small chronic infarct in left lateral parietal lobe. Electronically Signed   By: Mitzi Hansen M.D.   On: 03/21/2017 03:37   Ct Abdomen Pelvis W Contrast  Result Date: 03/20/2017 CLINICAL DATA:  84 y/o F; cough and chills. Mass of the lower abdomen. EXAM: CT CHEST, ABDOMEN, AND PELVIS WITH CONTRAST TECHNIQUE: Multidetector CT imaging of the chest, abdomen and pelvis was performed following the standard protocol during bolus administration of intravenous contrast. CONTRAST:  ISOVUE-300 IOPAMIDOL (ISOVUE-300) INJECTION 61% COMPARISON:  None. FINDINGS: CT CHEST FINDINGS Cardiovascular: Mild cardiomegaly. Mitral annular calcification. Normal caliber thoracic aorta with mild calcific atherosclerosis. Mildly enlarged main pulmonary artery measuring 3.6 cm. Mediastinum/Nodes: 11 mm nodule within the left lobe of thyroid. No mediastinal adenopathy. Mildly patulous thoracic esophagus. Large hiatal hernia with a large portion of stomach contained in the lower mediastinum. Lungs/Pleura: Bilateral dependent lower lobe airway debris and areas of consolidation compatible with pneumonia, possibly aspiration given distribution. Small bilateral pleural effusions. No pneumothorax. Musculoskeletal: No acute fracture. CT ABDOMEN PELVIS FINDINGS Hepatobiliary: No focal liver abnormality is seen. No gallstones, gallbladder wall thickening, or biliary dilatation. Pancreas: Unremarkable. No pancreatic ductal dilatation or surrounding inflammatory changes. Spleen:  Normal in size without focal abnormality. Adrenals/Urinary Tract: Adrenal glands are unremarkable. Multiple renal cysts measuring up to 21 mm in the right kidney. Kidneys otherwise are normal, without renal calculi, focal lesion, or hydronephrosis. Bladder collapsed around Foley catheter. Stomach/Bowel: Stomach is within normal limits. Appendix not identified, no pericecal inflammation. No evidence of bowel wall thickening, distention, or inflammatory changes. Large volume of stool in the rectal vault. Vascular/Lymphatic: Aortic atherosclerosis.  No enlarged abdominal or pelvic lymph nodes. Reproductive: Status post hysterectomy. No adnexal masses. Other: No abdominal wall hernia or abnormality. No abdominopelvic ascites. Musculoskeletal: No fracture is seen. Advanced spondylosis of the lumbar spine. Multifactorial probable severe canal stenosis at the L3-4 level. IMPRESSION: 1. Bilateral dependent lower lobe airway debris and consolidation compatible with pneumonia, possibly aspiration given distribution. Small bilateral effusions. 2. Mild cardiomegaly. 3. Enlarged main pulmonary artery may represent pulmonary artery hypertension. 4. No acute process of abdomen or pelvis identified. 5. Advanced spondylosis of lumbar spine with probable multifactorial severe L3-4 canal stenosis. Electronically Signed   By: Mitzi Hansen M.D.   On: 03/20/2017 23:25   Dg Swallowing Func-speech Pathology  Result Date: 03/22/2017 Objective Swallowing Evaluation: Type of Study: MBS-Modified Barium Swallow Study  Patient Details Name: AZENETH CARBONELL MRN: [id number] Date of Birth: [birth year] Today's Date: 03/22/2017 Time: SLP Start Time (ACUTE ONLY): 1010 -SLP Stop Time (ACUTE ONLY): 1029 SLP Time Calculation (min) (ACUTE ONLY): 19 min Past Medical History: Past Medical History: Diagnosis Date . GIB (gastrointestinal bleeding)  . Parkinson disease (HCC)  . PUD (peptic ulcer disease)  Past Surgical History: Past Surgical History: Procedure Laterality Date . ESOPHAGOGASTRODUODENOSCOPY   . TEE WITHOUT CARDIOVERSION N/A 03/07/2015  Procedure: TRANSESOPHAGEAL ECHOCARDIOGRAM (TEE);  Surgeon: Wendall Stade, MD;  Location: [facility] ENDOSCOPY;  Service: Cardiovascular;  Laterality: N/A; HPI: [name] a 84 y.o.femalewith medical history significant ofParkinson'sDz,GI bleed,PUD, history of CVA;who initially presented to the hospital for reports of a new lower abdominal mass.  Last seen in the emergency department on 1/16, for complaints of lower leg swelling  diagnosed with venous stasis discharged home on doxycycline and Lasix. Patient had about 4 days of diarrhea and thereafter has not had any significant bowel movement. Since that time there has been a progressive decline. Family reports that she has not gotten out of bed and has had poor appetite. She has been trying to keep herself hydrated,but does not want much food. Family also noted a sore on her sacrum for which they have been placing cream on to have pill. Other associated symptoms include lethargy, decreased urine output, andmild productive cough with green sputum for the last 2-3 days. CT scan showing new right occipital and posterior temporal lobe infarcts.Also dx wtih CAP vs aspiration PNA with imaging showing signs suggestive of bilateral lobe PNA, question of UTI.  No Data Recorded Assessment / Plan / Recommendation CHL IP CLINICAL IMPRESSIONS 03/22/2017 Clinical Impression Pt demonstrates a moderate oropharyngeal dysphagia characterized by horizontal positioning of pharynx due to curvature of spine with delay in swallow initiation, likely impaired from baseline age related function due to acute illnesses and deconditioning. If pt is given teaspoon size boluses, thin or thick, she is able to protect airway prior to spillage to trachea, though some silent frank penetration events do occur. Pt ejects penetrate with a cued throat clear. If pt consumes honey thick liquids, regardless of bolus size she is also able to initiate swallow prior to bolus arrival to the vestible. Pts positioning makes cup sips difficult.  Pt is able to sip honey via straw, though it is not easy. There is no pharyngeal weakness observed, cough/throat clear is adequately strong if triggered. Depending on goals of care, pt may benefit from honey liquids over a short term to reduce aspiration risk while she is recovering. She could also be given teaspoons of thin water if being assisted by an attentive, trained careguver to cue  for an immediate throat clear after each sip. Given family reports of decreasing oral intake and dehydration, these restriction may lead to further medical decline. Will discuss with family.  SLP Visit Diagnosis -- Attention and concentration deficit following -- Frontal lobe and executive function deficit following -- Impact on safety and function --   CHL IP TREATMENT RECOMMENDATION 03/22/2017 Treatment Recommendations Therapy as outlined in treatment plan below   Prognosis 03/22/2017 Prognosis for Safe Diet Advancement Fair Barriers to Reach Goals -- Barriers/Prognosis Comment -- CHL IP DIET RECOMMENDATION 03/22/2017 SLP Diet Recommendations Dysphagia 1 (Puree) solids;Honey thick liquids;Thin liquid Liquid Administration via Spoon;Straw Medication Administration Crushed with puree Compensations Slow rate;Small sips/bites;Clear throat after each swallow Postural Changes Remain semi-upright after after feeds/meals (Comment)   CHL IP OTHER RECOMMENDATIONS 03/22/2017 Recommended Consults -- Oral Care Recommendations Oral care BID Other Recommendations Order thickener from pharmacy   CHL IP FOLLOW UP RECOMMENDATIONS 03/22/2017 Follow up Recommendations Skilled Nursing facility   [facility] IP FREQUENCY AND DURATION 03/22/2017 Speech Therapy Frequency (ACUTE ONLY) min 2x/week Treatment Duration 2 weeks      CHL IP ORAL PHASE 03/22/2017 Oral Phase Impaired Oral - Pudding Teaspoon -- Oral - Pudding Cup -- Oral - Honey Teaspoon WFL Oral - Honey Cup -- Oral - Nectar Teaspoon WFL Oral - Nectar Cup -- Oral - Nectar Straw WFL Oral - Thin Teaspoon WFL Oral - Thin Cup -- Oral - Thin Straw WFL Oral - Puree WFL Oral - Mech Soft -- Oral - Regular Impaired mastication Oral - Multi-Consistency -- Oral - Pill -- Oral Phase - Comment --  CHL IP PHARYNGEAL PHASE 03/22/2017 Pharyngeal Phase Impaired Pharyngeal- Pudding Teaspoon -- Pharyngeal -- Pharyngeal- Pudding Cup -- Pharyngeal -- Pharyngeal- Honey Teaspoon Delayed swallow initiation-vallecula Pharyngeal  -- Pharyngeal- Honey Cup -- Pharyngeal -- Pharyngeal- Nectar Teaspoon Delayed swallow initiation-vallecula;Delayed swallow initiation-pyriform sinuses;Penetration/Aspiration before swallow Pharyngeal Material enters airway, CONTACTS cords and not ejected out;Material does not enter airway Pharyngeal- Nectar Cup -- Pharyngeal -- Pharyngeal- Nectar Straw Penetration/Aspiration before swallow;Delayed swallow initiation-pyriform sinuses Pharyngeal Material enters airway, CONTACTS cords and not ejected out Pharyngeal- Thin Teaspoon Penetration/Aspiration before swallow;Delayed swallow initiation-pyriform sinuses Pharyngeal Material enters airway, remains ABOVE vocal cords and not ejected out;Material does not enter airway Pharyngeal- Thin Cup -- Pharyngeal -- Pharyngeal- Thin Straw Penetration/Aspiration before swallow;Moderate aspiration;Delayed swallow initiation-pyriform sinuses Pharyngeal Material enters airway, passes BELOW cords and not ejected out despite cough attempt by patient Pharyngeal- Puree Delayed swallow initiation-vallecula Pharyngeal -- Pharyngeal- Mechanical Soft -- Pharyngeal -- Pharyngeal- Regular Delayed swallow initiation-vallecula Pharyngeal -- Pharyngeal- Multi-consistency -- Pharyngeal -- Pharyngeal- Pill -- Pharyngeal -- Pharyngeal Comment --  CHL IP CERVICAL ESOPHAGEAL PHASE 03/22/2017 Cervical Esophageal Phase WFL Pudding Teaspoon -- Pudding Cup -- Honey Teaspoon -- Honey Cup -- Nectar Teaspoon -- Nectar Cup -- Nectar Straw -- Thin Teaspoon -- Thin Cup -- Thin Straw -- Puree -- Mechanical Soft -- Regular -- Multi-consistency -- Pill -- Cervical Esophageal Comment -- No flowsheet data found. Harlon Ditty, [place] CCC-SLP [phone] Claudine Mouton 03/22/2017, 10:47 AM  Scheduled Meds: . aspirin EC  81 mg Oral Daily  . pantoprazole  40 mg Oral Daily  . pravastatin  20 mg Oral q1800  . Rivaroxaban  15 mg Oral BID WC   Continuous Infusions: . azithromycin Stopped  (03/22/17 0010)  . cefTRIAXone (ROCEPHIN)  IV Stopped (03/21/17 2220)     LOS: 1 day    Time spent: > 35 minutes    Penny Pia, MD [facility] Pager [phone]  If 7PM-7AM, please contact night-coverage [email or web address] Password [email or web address] 03/22/2017, 4:53 PM

## 2017-03-22 NOTE — [place] FL2 (Signed)
[place] MEDICAID FL2 LEVEL OF CARE SCREENING TOOL     IDENTIFICATION  Patient Name: Allison Marshall Birthdate: April 06, 1926 Sex: female Admission Date (Current Location): 03/20/2017  [facility] and [place] Number:  Producer, television/film/video and Address:  [facility], [address], [place], [place] [address]      Provider Number: [id number]  Attending Physician Name and Address:  Penny Pia, MD  Relative Name and Phone Number:       Current Level of Care: Hospital Recommended Level of Care: Skilled Nursing Facility Prior Approval Number:    Date Approved/Denied:   PASRR Number: Manual Review  Discharge Plan: SNF    Current Diagnoses: Patient Active Problem List   Diagnosis Date Noted  . CVA (cerebral vascular accident) (HCC) 03/21/2017  . Urinary obstruction 03/21/2017  . Leukocytosis 03/21/2017  . Community acquired pneumonia 03/21/2017  . Hyperkalemia 03/21/2017  . Pressure ulcer of sacrum 03/21/2017  . Deep venous thrombosis (HCC)   . Difficulty speaking 03/02/2015  . PUD (peptic ulcer disease)   . Cerebral infarction due to unspecified mechanism   . Parkinson disease (HCC)     Orientation RESPIRATION BLADDER Height & Weight     Self  Normal Incontinent, External catheter(catheter placed 03/20/17) Weight: 115 lb (52.2 kg) Height:  5\' 6"  (167.6 cm)  BEHAVIORAL SYMPTOMS/MOOD NEUROLOGICAL BOWEL NUTRITION STATUS      (unknown) Diet(puree solids, honey thick liquids)  AMBULATORY STATUS COMMUNICATION OF NEEDS Skin   Extensive Assist Verbally PU Stage and Appropriate Care, Other (Comment)(open wounds between toes on both feet, gauze dressing) PU Stage 1 Dressing: (unstageable on sacrum, foam dressing)                     Personal Care Assistance Level of Assistance  Bathing, Feeding, Dressing Bathing Assistance: Maximum assistance Feeding assistance: Limited assistance Dressing Assistance: Maximum assistance     Functional  Limitations Info  Sight, Hearing, Speech Sight Info: Adequate Hearing Info: Impaired Speech Info: Adequate    SPECIAL CARE FACTORS FREQUENCY  PT (By licensed PT), OT (By licensed OT), Speech therapy     PT Frequency: 5x/wk OT Frequency: 5x/wk     Speech Therapy Frequency: 5x/wk      Contractures Contractures Info: Not present    Additional Factors Info  Code Status, Allergies Code Status Info: DNR Allergies Info: Influenza Vac Split Flu Virus Vaccine           Current Medications (03/22/2017):  This is the current hospital active medication list Current Facility-Administered Medications  Medication Dose Route Frequency Provider Last Rate Last Dose  . acetaminophen (TYLENOL) tablet 650 mg  650 mg Oral Q4H PRN Madelyn Flavors A, MD       Or  . acetaminophen (TYLENOL) solution 650 mg  650 mg Per Tube Q4H PRN Madelyn Flavors A, MD       Or  . acetaminophen (TYLENOL) suppository 650 mg  650 mg Rectal Q4H PRN Madelyn Flavors A, MD      . aspirin EC tablet 81 mg  81 mg Oral Daily Katrinka Blazing, Rondell A, MD   81 mg at 03/22/17 0916  . azithromycin (ZITHROMAX) 500 mg in dextrose 5 % 250 mL IVPB  500 mg Intravenous Q24H Clydie Braun, MD   Stopped at 03/22/17 0010  . cefTRIAXone (ROCEPHIN) 1 g in dextrose 5 % 50 mL IVPB  1 g Intravenous Q24H Stevphen Rochester, RPH   Stopped at 03/21/17 2220  .  pantoprazole (PROTONIX) EC tablet 40 mg  40 mg Oral Daily Madelyn FlavorsSmith, Rondell A, MD   40 mg at 03/22/17 0917  . pravastatin (PRAVACHOL) tablet 20 mg  20 mg Oral q1800 Marvel PlanXu, Jindong, MD      . Rivaroxaban Carlena Hurl(XARELTO) tablet 15 mg  15 mg Oral BID WC Penny PiaVega, [place], MD   15 mg at 03/22/17 0916  . senna-docusate (Senokot-S) tablet 1 tablet  1 tablet Oral QHS PRN Clydie BraunSmith, Rondell A, MD         Discharge Medications: Please see discharge summary for a list of discharge medications.  Relevant Imaging Results:  Relevant Lab Results:   Additional Information SS#: [id number]  Baldemar LenisElizabeth M Dorina Ribaudo, LCSW

## 2017-03-22 NOTE — Care Management Note (Addendum)
Case Management Note  Patient Details  Name: Allison Marshall MRN: [name] Date of Birth: 03/13/1926  Subjective/Objective:    Pt admitted with CVA. She is from home alone but has supportive daughter.    PCP: Dr Aida PufferJames Little Insurance: Medicare            Action/Plan: Awaiting PT/OT recommendations. CM placed benefits check for Xarelto started this admission. CM following for d/c disposition.  Expected Discharge Date:                  Expected Discharge Plan:     In-House Referral:     Discharge planning Services     Post Acute Care Choice:    Choice offered to:     DME Arranged:    DME Agency:     HH Arranged:    HH Agency:     Status of Service:  In process, will continue to follow  If discussed at Long Length of Stay Meetings, dates discussed:    Additional Comments:  Kermit BaloKelli F Hillery Bhalla, RN 03/22/2017, 10:27 AM

## 2017-03-22 NOTE — Evaluation (Signed)
Physical Therapy Evaluation Patient Details Name: Allison Marshall MRN: [id number] DOB: 1926-11-17 Today's Date: 03/22/2017   History of Present Illness  On this admission, pt was found to have a pneumonia, UTI, decubitus ulcer, and was put on antibiotics. Also found bil DVT and placed on Xarelto.  Also new dx of right MCA and PCA moderate sized, right cerebellum and left periventricular punctate infarcts embolic pattern. PMH: CVA, GIB, PArkinsons  Clinical Impression  Pt admitted with above diagnosis. Pt currently with functional limitations due to the deficits listed below (see PT Problem List). Pt was able to sit EOB for 10 minutes with min guard assist.  Mod assist to come to EOB.  Will benefit from PT.  Will follow acutely.  If pt does not have 24 hour care, recommend SNF.  Pt will benefit from skilled PT to increase their independence and safety with mobility to allow discharge to the venue listed below.      Follow Up Recommendations SNF;Supervision/Assistance - 24 hour    Equipment Recommendations  Other (comment)(TBA)    Recommendations for Other Services       Precautions / Restrictions Precautions Precautions: Fall Restrictions Weight Bearing Restrictions: No      Mobility  Bed Mobility Overal bed mobility: Needs Assistance Bed Mobility: Supine to Sit     Supine to sit: Mod assist;Max assist     General bed mobility comments: assist for trunk elevation  Transfers Overall transfer level: Needs assistance               General transfer comment: unable to assess- pt hasnt done more than sit EOB in 12 days.  Ambulation/Gait                Stairs            Wheelchair Mobility    Modified Rankin (Stroke Patients Only) Modified Rankin (Stroke Patients Only) Pre-Morbid Rankin Score: Moderate disability Modified Rankin: Severe disability     Balance Overall balance assessment: Needs assistance;History of Falls Sitting-balance support: No  upper extremity supported;Feet supported Sitting balance-Leahy Scale: Fair Sitting balance - Comments: Sat EOB x 10 minutes without UE support.  Kicked her LEs                                      Pertinent Vitals/Pain Pain Assessment: No/denies pain    Home Living Family/patient expects to be discharged to:: Private residence Living Arrangements: Alone Available Help at Discharge: Family;Available 24 hours/day Type of Home: House Home Access: Level entry     Home Layout: Two level Home Equipment: Walker - 2 wheels;Cane - single point;Bedside commode;Toilet riser;Tub bench      Prior Function Level of Independence: Independent with assistive device(s);Needs assistance   Gait / Transfers Assistance Needed: used cane up until 1/18 and she used RW until 1/20 and then has not walked or been out of bed since 1/20.    ADL's / Homemaking Assistance Needed: Sponge bathes on her own and dressed herself until 1/20         Hand Dominance   Dominant Hand: Right    Extremity/Trunk Assessment   Upper Extremity Assessment Upper Extremity Assessment: Defer to OT evaluation    Lower Extremity Assessment Lower Extremity Assessment: RLE deficits/detail;LLE deficits/detail RLE Deficits / Details: grossly 3-/5 LLE Deficits / Details: grossly 3-/5    Cervical / Trunk Assessment Cervical / Trunk Assessment: Kyphotic  Communication   Communication: Expressive difficulties  Cognition Arousal/Alertness: Awake/alert Behavior During Therapy: Flat affect Overall Cognitive Status: History of cognitive impairments - at baseline                                        General Comments      Exercises General Exercises - Lower Extremity Long Arc Quad: AROM;Both;5 reps;Seated   Assessment/Plan    PT Assessment Patient needs continued PT services  PT Problem List Decreased activity tolerance;Decreased balance;Decreased strength;Decreased mobility;Decreased  knowledge of use of DME;Decreased safety awareness;Decreased knowledge of precautions;Cardiopulmonary status limiting activity       PT Treatment Interventions DME instruction;Gait training;Functional mobility training;Therapeutic activities;Therapeutic exercise;Balance training;Patient/family education    PT Goals (Current goals can be found in the Care Plan section)  Acute Rehab PT Goals Patient Stated Goal: to get better per daughter PT Goal Formulation: Patient unable to participate in goal setting Time For Goal Achievement: 04/05/17 Potential to Achieve Goals: Good    Frequency Min 3X/week   Barriers to discharge        Co-evaluation               AM-PAC PT "6 Clicks" Daily Activity  Outcome Measure Difficulty turning over in bed (including adjusting bedclothes, sheets and blankets)?: Unable Difficulty moving from lying on back to sitting on the side of the bed? : Unable Difficulty sitting down on and standing up from a chair with arms (e.g., wheelchair, bedside commode, etc,.)?: Unable Help needed moving to and from a bed to chair (including a wheelchair)?: Total Help needed walking in hospital room?: Total Help needed climbing 3-5 steps with a railing? : Total 6 Click Score: 6    End of Session Equipment Utilized During Treatment: Gait belt Activity Tolerance: Patient limited by fatigue Patient left: in bed;with call bell/phone within reach;with bed alarm set;with family/visitor present Nurse Communication: Mobility status PT Visit Diagnosis: Unsteadiness on feet (R26.81);Muscle weakness (generalized) (M62.81);Hemiplegia and hemiparesis Hemiplegia - Right/Left: Left Hemiplegia - dominant/non-dominant: Non-dominant Hemiplegia - caused by: Nontraumatic intracerebral hemorrhage    Time: 1225-1242 PT Time Calculation (min) (ACUTE ONLY): 17 min   Charges:   PT Evaluation $PT Eval Moderate Complexity: 1 Mod     PT G Codes:        Allison Marshall,PT Acute  Rehabilitation [phone])   Berline Lopesawn F Keyla Milone 03/22/2017, 2:57 PM

## 2017-03-22 NOTE — Progress Notes (Signed)
STROKE TEAM PROGRESS NOTE   SUBJECTIVE (INTERVAL HISTORY) No family is at the bedside.  Patient is found laying in bed in NAD. Recently returned for MBS. Voices no new concerns. Follows all simple commands. No acute events reported overnight.  OBJECTIVE Temp:  [97.7 F (36.5 C)-98.7 F (37.1 C)] 98.4 F (36.9 C) (02/01 0601) Pulse Rate:  [66-86] 86 (02/01 0601) Cardiac Rhythm: Normal sinus rhythm;Heart block (02/01 0701) Resp:  [16-18] 18 (02/01 0601) BP: (101-162)/(45-86) 162/86 (02/01 0601) SpO2:  [96 %-99 %] 96 % (02/01 0601)  No results for input(s): GLUCAP in the last 168 hours. Recent Labs  Lab 03/20/17 2011 03/20/17 2136 03/21/17 0206  NA 134* 134* 133*  K 6.2* 4.8 4.2  CL 98* 101 99*  CO2 23  --  23  GLUCOSE 88 84 132*  BUN 33* 34* 28*  CREATININE 0.87 0.80 0.79  CALCIUM 8.6*  --  8.1*   Recent Labs  Lab 03/20/17 2011 03/21/17 0206  AST 52* 31  ALT 13* 15  ALKPHOS 64 54  BILITOT 2.2* 1.2  PROT 5.7* 5.1*  ALBUMIN 2.7* 2.4*   Recent Labs  Lab 03/20/17 2011 03/20/17 2136 03/21/17 0206  WBC 11.1*  --  9.7  NEUTROABS 8.3*  --  7.3  HGB 15.3* 14.6 13.6  HCT 45.2 43.0 40.7  MCV 93.8  --  94.0  PLT 139*  --  116*   Recent Labs  Lab 03/21/17 0206  CKTOTAL 48   Recent Labs    03/20/17 2011  LABPROT 13.1  INR 1.00   Recent Labs    03/20/17 2114 03/20/17 2250  COLORURINE YELLOW BROWN*  LABSPEC 1.014 1.025  PHURINE 5.0 6.5  GLUCOSEU NEGATIVE NEGATIVE  HGBUR LARGE* LARGE*  BILIRUBINUR NEGATIVE MODERATE*  KETONESUR NEGATIVE 15*  PROTEINUR NEGATIVE 100*  NITRITE NEGATIVE POSITIVE*  LEUKOCYTESUR NEGATIVE MODERATE*       Component Value Date/Time   CHOL 138 03/21/2017 0206   TRIG 125 03/21/2017 0206   HDL 25 (L) 03/21/2017 0206   CHOLHDL 5.5 03/21/2017 0206   VLDL 25 03/21/2017 0206   LDLCALC 88 03/21/2017 0206   Lab Results  Component Value Date   HGBA1C 5.6 03/21/2017   No results found for: LABOPIA, COCAINSCRNUR, LABBENZ, AMPHETMU,  THCU, LABBARB  No results for input(s): ETH in the last 168 hours.  I have personally reviewed the radiological images below and agree with the radiology interpretations.  Dg Chest 2 View  Result Date: 03/20/2017 CLINICAL DATA:  Altered mental status. Productive cough. Not feeling well since last Thursday. Mass on the lower abdomen. EXAM: CHEST  2 VIEW COMPARISON:  03/02/2015 FINDINGS: Cardiac enlargement. Bilateral hilar enlargement, greatest on the right. This may represent prominent pulmonary arteries due to pulmonary arterial hypertension but lymphadenopathy is also possible. Suggest CT chest for further evaluation. Emphysematous changes and scattered fibrosis in the lungs. No airspace disease or consolidation. Esophageal hiatal hernia behind the heart. Blunting of the costophrenic angles suggesting bilateral effusions or thickened pleura. Calcification of the aorta. Degenerative changes in the spine. IMPRESSION: 1. Cardiac enlargement.  No vascular congestion or edema. 2. Nonspecific hilar prominence, particularly on the right. Suggest CT to exclude lymphadenopathy. 3. Emphysematous changes and chronic bronchitic changes in the lungs. No focal consolidation. 4. Aortic atherosclerosis. Electronically Signed   By: Burman Nieves M.D.   On: 03/20/2017 21:46   Ct Head Wo Contrast  Result Date: 03/20/2017 CLINICAL DATA:  Altered level of consciousness. EXAM: CT HEAD WITHOUT CONTRAST TECHNIQUE:  Contiguous axial images were obtained from the base of the skull through the vertex without intravenous contrast. COMPARISON:  03/02/2015 FINDINGS: Brain: Infarct again noted in the left posterior parietal lobe as seen on prior CT and MRI. New area of low-density noted in the right occipital and posterior temporal lobes compatible with acute to subacute infarct. No hemorrhage. No hydrocephalus. Vascular: No hyperdense vessel or unexpected calcification. Skull: No acute calvarial abnormality. Sinuses/Orbits: Near  complete opacification of the left sphenoid sinus. Mucosal thickening throughout the remainder of the paranasal sinuses. Mastoids are clear. Other: None IMPRESSION: Stable left posterior parietal infarct. New acute to subacute infarct in the right occipital and posterior temporal lobes. Electronically Signed   By: Charlett Nose M.D.   On: 03/20/2017 22:14   Ct Chest W Contrast  Result Date: 03/20/2017 CLINICAL DATA:  84 y/o F; cough and chills. Mass of the lower abdomen. EXAM: CT CHEST, ABDOMEN, AND PELVIS WITH CONTRAST TECHNIQUE: Multidetector CT imaging of the chest, abdomen and pelvis was performed following the standard protocol during bolus administration of intravenous contrast. CONTRAST:  ISOVUE-300 IOPAMIDOL (ISOVUE-300) INJECTION 61% COMPARISON:  None. FINDINGS: CT CHEST FINDINGS Cardiovascular: Mild cardiomegaly. Mitral annular calcification. Normal caliber thoracic aorta with mild calcific atherosclerosis. Mildly enlarged main pulmonary artery measuring 3.6 cm. Mediastinum/Nodes: 11 mm nodule within the left lobe of thyroid. No mediastinal adenopathy. Mildly patulous thoracic esophagus. Large hiatal hernia with a large portion of stomach contained in the lower mediastinum. Lungs/Pleura: Bilateral dependent lower lobe airway debris and areas of consolidation compatible with pneumonia, possibly aspiration given distribution. Small bilateral pleural effusions. No pneumothorax. Musculoskeletal: No acute fracture. CT ABDOMEN PELVIS FINDINGS Hepatobiliary: No focal liver abnormality is seen. No gallstones, gallbladder wall thickening, or biliary dilatation. Pancreas: Unremarkable. No pancreatic ductal dilatation or surrounding inflammatory changes. Spleen: Normal in size without focal abnormality. Adrenals/Urinary Tract: Adrenal glands are unremarkable. Multiple renal cysts measuring up to 21 mm in the right kidney. Kidneys otherwise are normal, without renal calculi, focal lesion, or hydronephrosis.  Bladder collapsed around Foley catheter. Stomach/Bowel: Stomach is within normal limits. Appendix not identified, no pericecal inflammation. No evidence of bowel wall thickening, distention, or inflammatory changes. Large volume of stool in the rectal vault. Vascular/Lymphatic: Aortic atherosclerosis. No enlarged abdominal or pelvic lymph nodes. Reproductive: Status post hysterectomy. No adnexal masses. Other: No abdominal wall hernia or abnormality. No abdominopelvic ascites. Musculoskeletal: No fracture is seen. Advanced spondylosis of the lumbar spine. Multifactorial probable severe canal stenosis at the L3-4 level. IMPRESSION: 1. Bilateral dependent lower lobe airway debris and consolidation compatible with pneumonia, possibly aspiration given distribution. Small bilateral effusions. 2. Mild cardiomegaly. 3. Enlarged main pulmonary artery may represent pulmonary artery hypertension. 4. No acute process of abdomen or pelvis identified. 5. Advanced spondylosis of lumbar spine with probable multifactorial severe L3-4 canal stenosis. Electronically Signed   By: Mitzi Hansen M.D.   On: 03/20/2017 23:25   Mr Maxine Glenn Head Wo Contrast  Result Date: 03/21/2017 CLINICAL DATA:  84 y/o  F; stroke for follow-up. EXAM: MRI HEAD WITHOUT CONTRAST MRA HEAD WITHOUT CONTRAST TECHNIQUE: Multiplanar, multiecho pulse sequences of the brain and surrounding structures were obtained without intravenous contrast. Angiographic images of the head were obtained using MRA technique without contrast. COMPARISON:  03/20/2017 CT head.  03/03/2015 MRI head and MRA head. FINDINGS: MRI HEAD FINDINGS Brain: Large right PCA distribution infarct with mildly reduced diffusion, T2 FLAIR hyperintensity, and local mass effect, likely late acute/early subacute. Small foci of susceptibility hypointensity with T1 shortening  are compatible with petechial hemorrhage. Partial effacement of the atria of right lateral ventricle. Several additional  punctate infarcts are present within the bilateral frontal and parietal cortices and the right cerebellum with similar diffusion, also likely late acute/early subacute. Stable chronic infarction in the left lateral parietal lobe. Stable moderate chronic microvascular ischemic changes of white matter and parenchymal volume loss of the brain. Vascular: As below. Skull and upper cervical spine: Normal marrow signal. Sinuses/Orbits: No abnormal signal of mastoid air cells. Bilateral intra-ocular lens replacement. Moderate paranasal sinus mucosal thickening and left sphenoid sinus opacification. Other: None. MRA HEAD FINDINGS Internal carotid arteries:  Patent. Anterior cerebral arteries:  Patent. Middle cerebral arteries: Patent. Anterior communicating artery: Patent. Posterior communicating arteries:  Patent. Posterior cerebral arteries:  Patent. Basilar artery:  Patent. Vertebral arteries:  Patent. No evidence of high-grade stenosis, large vessel occlusion, or aneurysm. IMPRESSION: 1. Large late acute/early subacute infarction in right PCA territory with petechial hemorrhage. Mild local mass effect. No herniation. 2. Additional punctate infarcts are present in bilateral frontal and parietal cortices as well as right cerebellar hemisphere. 3. Patent anterior and posterior intracranial circulation. No large vessel occlusion, aneurysm, or significant stenosis is identified. 4. Stable chronic microvascular ischemic changes and parenchymal volume loss of the brain. Stable small chronic infarct in left lateral parietal lobe. Electronically Signed   By: Mitzi Hansen M.D.   On: 03/21/2017 03:37   Mr Brain Wo Contrast  Result Date: 03/21/2017 CLINICAL DATA:  84 y/o  F; stroke for follow-up. EXAM: MRI HEAD WITHOUT CONTRAST MRA HEAD WITHOUT CONTRAST TECHNIQUE: Multiplanar, multiecho pulse sequences of the brain and surrounding structures were obtained without intravenous contrast. Angiographic images of the head  were obtained using MRA technique without contrast. COMPARISON:  03/20/2017 CT head.  03/03/2015 MRI head and MRA head. FINDINGS: MRI HEAD FINDINGS Brain: Large right PCA distribution infarct with mildly reduced diffusion, T2 FLAIR hyperintensity, and local mass effect, likely late acute/early subacute. Small foci of susceptibility hypointensity with T1 shortening are compatible with petechial hemorrhage. Partial effacement of the atria of right lateral ventricle. Several additional punctate infarcts are present within the bilateral frontal and parietal cortices and the right cerebellum with similar diffusion, also likely late acute/early subacute. Stable chronic infarction in the left lateral parietal lobe. Stable moderate chronic microvascular ischemic changes of white matter and parenchymal volume loss of the brain. Vascular: As below. Skull and upper cervical spine: Normal marrow signal. Sinuses/Orbits: No abnormal signal of mastoid air cells. Bilateral intra-ocular lens replacement. Moderate paranasal sinus mucosal thickening and left sphenoid sinus opacification. Other: None. MRA HEAD FINDINGS Internal carotid arteries:  Patent. Anterior cerebral arteries:  Patent. Middle cerebral arteries: Patent. Anterior communicating artery: Patent. Posterior communicating arteries:  Patent. Posterior cerebral arteries:  Patent. Basilar artery:  Patent. Vertebral arteries:  Patent. No evidence of high-grade stenosis, large vessel occlusion, or aneurysm. IMPRESSION: 1. Large late acute/early subacute infarction in right PCA territory with petechial hemorrhage. Mild local mass effect. No herniation. 2. Additional punctate infarcts are present in bilateral frontal and parietal cortices as well as right cerebellar hemisphere. 3. Patent anterior and posterior intracranial circulation. No large vessel occlusion, aneurysm, or significant stenosis is identified. 4. Stable chronic microvascular ischemic changes and parenchymal  volume loss of the brain. Stable small chronic infarct in left lateral parietal lobe. Electronically Signed   By: Mitzi Hansen M.D.   On: 03/21/2017 03:37   Ct Abdomen Pelvis W Contrast  Result Date: 03/20/2017 CLINICAL DATA:  84 y/o F;  cough and chills. Mass of the lower abdomen. EXAM: CT CHEST, ABDOMEN, AND PELVIS WITH CONTRAST TECHNIQUE: Multidetector CT imaging of the chest, abdomen and pelvis was performed following the standard protocol during bolus administration of intravenous contrast. CONTRAST:  100mL ISOVUE-300 IOPAMIDOL (ISOVUE-300) INJECTION 61% COMPARISON:  None. FINDINGS: CT CHEST FINDINGS Cardiovascular: Mild cardiomegaly. Mitral annular calcification. Normal caliber thoracic aorta with mild calcific atherosclerosis. Mildly enlarged main pulmonary artery measuring 3.6 cm. Mediastinum/Nodes: 11 mm nodule within the left lobe of thyroid. No mediastinal adenopathy. Mildly patulous thoracic esophagus. Large hiatal hernia with a large portion of stomach contained in the lower mediastinum. Lungs/Pleura: Bilateral dependent lower lobe airway debris and areas of consolidation compatible with pneumonia, possibly aspiration given distribution. Small bilateral pleural effusions. No pneumothorax. Musculoskeletal: No acute fracture. CT ABDOMEN PELVIS FINDINGS Hepatobiliary: No focal liver abnormality is seen. No gallstones, gallbladder wall thickening, or biliary dilatation. Pancreas: Unremarkable. No pancreatic ductal dilatation or surrounding inflammatory changes. Spleen: Normal in size without focal abnormality. Adrenals/Urinary Tract: Adrenal glands are unremarkable. Multiple renal cysts measuring up to 21 mm in the right kidney. Kidneys otherwise are normal, without renal calculi, focal lesion, or hydronephrosis. Bladder collapsed around Foley catheter. Stomach/Bowel: Stomach is within normal limits. Appendix not identified, no pericecal inflammation. No evidence of bowel wall thickening,  distention, or inflammatory changes. Large volume of stool in the rectal vault. Vascular/Lymphatic: Aortic atherosclerosis. No enlarged abdominal or pelvic lymph nodes. Reproductive: Status post hysterectomy. No adnexal masses. Other: No abdominal wall hernia or abnormality. No abdominopelvic ascites. Musculoskeletal: No fracture is seen. Advanced spondylosis of the lumbar spine. Multifactorial probable severe canal stenosis at the L3-4 level. IMPRESSION: 1. Bilateral dependent lower lobe airway debris and consolidation compatible with pneumonia, possibly aspiration given distribution. Small bilateral effusions. 2. Mild cardiomegaly. 3. Enlarged main pulmonary artery may represent pulmonary artery hypertension. 4. No acute process of abdomen or pelvis identified. 5. Advanced spondylosis of lumbar spine with probable multifactorial severe L3-4 canal stenosis. Electronically Signed   By: Mitzi HansenLance  Furusawa-Stratton M.D.   On: 03/20/2017 23:25   Carotid Doppler  Right Carotid: The extracranial vessels were near-normal with only minimal wall thickening or plaque. Left Carotid: The extracranial vessels were near-normal with only minimal wall thickening or plaque. Vertebrals: Both vertebral arteries were patent with antegrade flow. Subclavians: Normal flow hemodynamics were seen in bilateral subclavian arteries.  TTE  - Left ventricle: The cavity size was normal. Wall thickness was   normal. Systolic function was normal. The estimated ejection   fraction was in the range of 55% to 60%. Left ventricular   diastolic function parameters were normal. - Aortic valve: There was mild regurgitation. - Left atrium: Significant shadowing artifact in LA but cannot r/o   LA mass suggest TEE The atrium was mildly dilated. - Right ventricle: The cavity size was severely dilated. - Right atrium: The atrium was severely dilated. - Atrial septum: Septum not well seen however the basal aspect   appears aneursyms and  likely large PFO or possible venosous ASD   suggest bubble study   or f/u TEE. - Tricuspid valve: There was moderate regurgitation. - Pulmonary arteries: PA peak pressure: 60 mm Hg (S).  LE venous doppler  Right: There is evidence of acute, non occlusive DVT in the Common Femoral vein, and proximal Femoral vein. Left: There is evidence of occluisve acute DVT in the Common Femoral vein, Femoral vein, proximal Profunda vein, Popliteal vein, Posterior Tibial veins, and Peroneal veins, and in the external iliac vein. The  IVC is patent   PHYSICAL EXAM  Temp:  [97.7 F (36.5 C)-98.7 F (37.1 C)] 98.4 F (36.9 C) (02/01 0601) Pulse Rate:  [66-86] 86 (02/01 0601) Resp:  [16-18] 18 (02/01 0601) BP: (101-162)/(45-86) 162/86 (02/01 0601) SpO2:  [96 %-99 %] 96 % (02/01 0601)  General - Well nourished, well developed, lethargic.  Ophthalmologic - fundi not visualized due to noncooperation.  Cardiovascular - Regular rate and rhythm.  Mental Status -  Level of arousal and orientation to place, and person were intact, but not orientated to time. Language exam showed paucity of speech, moderate dysarthria, able to name 2/2 and repeat sentences. Follows all simple commands  Cranial Nerves II - XII - II - Visual field intact OU. III, IV, VI - Extraocular movements intact. V - Facial sensation intact bilaterally. VII - right nasolabial fold flattening. VIII - Hearing & vestibular intact bilaterally. X - Palate elevates symmetrically. XI - Chin turning & shoulder shrug intact bilaterally. XII - Tongue protrusion intact.  Motor Strength - The patient's strength was 4/5 BUEs and 3-/5 BLEs with bilateral LE swelling and pronator drift was absent.  Bulk was normal and fasciculations were absent.   Motor Tone - Muscle tone was assessed at the neck and appendages and was normal.  Reflexes - The patient's reflexes were symmetrical in all extremities and she had no pathological reflexes.  Sensory -  Light touch, temperature/pinprick were assessed and were symmetrical.    Coordination - The patient had normal movements in the hands with no ataxia or dysmetria, but slow motion.  Tremor was present at lip and tongue with mild resting tremor b/l arm.  Gait and Station - deferred.   ASSESSMENT/PLAN Allison Marshall is a 84 y.o. female with history of tremors, GI bleeding in 2011, stroke 2017, gradual weight loss admitted for progressive decline, leg swelling in 2 weeks. No tPA given due to out of window.    Stroke:  right MCA and PCA moderate sized, right cerebellum and left periventricular punctate infarcts embolic patter secondary to paradoxical emboli with LE DVT and large PFO.  Resultant lethargy and physical decline  MRI right PCA and right inferior MCA acute infarct, right cerebellum, left periventricular white matter punctate infarcts, and left MCA remote infarcts  MRA  Head negative  Carotid Doppler  negative  2D Echo EF 55-60%  LE venous doppler - bilateral LE acute DVT  Pan CT ruled out malignancy  LDL 88  HgbA1c 5.6  Xarelto for VTE prophylaxis Fall precautions Aspiration precautions  DIET - DYS 1 Room service appropriate? Yes; Fluid consistency: Honey Thick   aspirin 81 mg daily prior to admission, now on Xarelto (rivaroxaban) bid for DVT and stroke treatment.   Patient counseled to be compliant with her antithrombotic medications  Ongoing aggressive stroke risk factor management  Therapy recommendations:  pending  Disposition:  Pending  PFO  Large PFO reviewed in 02/2015 with TEE during stroke workup  Current stroke likely due to DVT in the setting of PFO  Bilateral DVT  Confirmed on LE venous Doppler  Consistent with history of bilateral lower extremity edema  On Xarelto for treatment  Bacteremia / sepsis  Anaerobic bottle grew gram-positive cocci  Sensitivity pending  If bacteremia confirmed, may need to consider TEE to rule out  endocarditis.  BP on the low side, hold off propranolol  History of stroke  02/2015 - speech difficulty - left MCA infarct, negative DVT, carotid Doppler negative, EF 60-65% but found  to have abnormal mobile linear density on the aortic side of the aortic valve, TEE showed large PFO, EF 60, mild AR with small lambl's excresence.   Hyperlipidemia  Home meds:  Pravastatin not taking   LDL 88, goal < 70  Now on pravastatin 20  Continue statin at discharge  Other Stroke Risk Factors  Advanced age  Other Active Problems  Pneumonia - on azithromycin  UTI - on rocephin  Tremor - was on propranolol 80mg  at home but recommend to hold off due to low BP   Hospital day # 1  Marvel Plan, MD PhD Stroke Neurology 03/22/2017 5:51 PM    To contact Stroke Continuity provider, please refer to [email or web address]. After hours, contact General Neurology

## 2017-03-22 NOTE — Progress Notes (Signed)
OT Evaluation  PTA, daughter states that her mother had been able to take care of herself until the last month "or so". Daughter states that her mother has been essentially bedbound for the last 2 weeks and family has been assisting with all self care. On evaluation, pt is overall total A for ADL at this time. Recommend palliative medicine consult. Pt will need rehab at SNF. Will follow acutely to address established goals.   03/22/17 1700  OT Visit Information  Last OT Received On 03/22/17  Assistance Needed +2  History of Present Illness On this admission, pt was found to have a pneumonia, UTI, decubitus ulcer, and was put on antibiotics. Also found bil DVT and placed on Xarelto.  Also new dx of right MCA and PCA moderate sized, right cerebellum and left periventricular punctate infarcts embolic pattern. PMH: CVA, GIB, [place]  Precautions  Precautions Fall  Precaution Comments at risk for skin breakdown  Restrictions  Weight Bearing Restrictions No  Home Living  Family/patient expects to be discharged to: Private residence  Living Arrangements Alone  Available Help at Discharge Family;Available 24 hours/day  Type of Home House  Home Access Level entry  Home Layout Two level  Alternate Level Stairs-Number of Steps flight  Bathroom Shower/Tub Tub/shower unit  Engineer, water - 2 wheels;Cane - single point;BSC;Toilet riser;Tub bench  Additional Comments daughter states pt's health had been declining over the last month and for 2 weeks PTA, she had been mostly bedbound  Lives With Alone (daughter across street)  Prior Function  Level of Independence Independent with assistive device(s);Needs assistance  Gait / Transfers Assistance Needed used cane up until 1/18 and she used RW until 1/20 and then has not walked or been out of bed since 1/20.    ADL's / Homemaking Assistance Needed Sponge bathes on her own and dressed herself until 1/20   Communication   Communication Expressive difficulties  Pain Assessment  Pain Assessment No/denies pain  Cognition  Arousal/Alertness Lethargic  Behavior During Therapy Flat affect  Overall Cognitive Status Impaired/Different from baseline  Area of Impairment Following commands;Orientation;Attention;Memory;Awareness;Problem solving  Orientation Level Disoriented to;Time;Situation  Memory Decreased short-term memory  Following Commands Follows one step commands inconsistently  Awareness Intellectual  Problem Solving Slow processing;Decreased initiation;Difficulty sequencing;Requires verbal cues;Requires tactile cues  Upper Extremity Assessment  Upper Extremity Assessment Generalized weakness (difficulty reaching to top of head; weak grip B; difficulty )  Lower Extremity Assessment  Lower Extremity Assessment Defer to PT evaluation  RLE Deficits / Details grossly 3-/5  LLE Deficits / Details grossly 3-/5  Cervical / Trunk Assessment  Cervical / Trunk Assessment Kyphotic  ADL  Overall ADL's  Needs assistance/impaired  Functional mobility during ADLs (+2 A)  General ADL Comments Total A at this time  Bed Mobility  Overal bed mobility Needs Assistance  Bed Mobility Supine to Sit  Supine to sit Max assist  Transfers  Overall transfer level Needs assistance  General transfer comment unable to assess- pt hasnt done more than sit EOB in 12 days.  OT - End of Session  Activity Tolerance Patient limited by fatigue;Patient limited by lethargy  Patient left in bed;with call bell/phone within reach;with family/visitor present  Nurse Communication Mobility status  OT Assessment  OT Recommendation/Assessment Patient needs continued OT Services  OT Visit Diagnosis Other abnormalities of gait and mobility (R26.89);Muscle weakness (generalized) (M62.81);Other symptoms and signs involving cognitive function  OT Problem List Decreased strength;Decreased range of motion;Decreased activity tolerance;Impaired  balance (sitting and/or standing);Decreased coordination;Decreased cognition;Decreased knowledge of use of DME or AE;Decreased knowledge of precautions;Impaired UE functional use  OT Plan  OT Frequency (ACUTE ONLY) Min 2X/week  OT Treatment/Interventions (ACUTE ONLY) Self-care/ADL training;Therapeutic exercise;DME and/or AE instruction;Therapeutic activities;Cognitive remediation/compensation;Patient/family education;Balance training  AM-PAC OT "6 Clicks" Daily Activity Outcome Measure  Help from another person eating meals? 1  Help from another person taking care of personal grooming? 1  Help from another person toileting, which includes using toliet, bedpan, or urinal? 1  Help from another person bathing (including washing, rinsing, drying)? 1  Help from another person to put on and taking off regular upper body clothing? 1  Help from another person to put on and taking off regular lower body clothing? 1  6 Click Score 6  ADL G Code Conversion CN  OT Recommendation  Follow Up Recommendations SNF;Supervision/Assistance - 24 hour  OT Equipment Other (comment) (TBA)  Individuals Consulted  Consulted and Agree with Results and Recommendations Family member/caregiver  Family Member Consulted daughter  Acute Rehab OT Goals  Patient Stated Goal to get better per daughter  OT Goal Formulation With patient/family  Time For Goal Achievement 04/05/17  Potential to Achieve Goals Fair  OT Time Calculation  OT Start Time (ACUTE ONLY) 1403  OT Stop Time (ACUTE ONLY) 1423  OT Time Calculation (min) 20 min  OT General Charges  $OT Visit 1 Visit  OT Evaluation  $OT Eval Moderate Complexity 1 Mod  Written Expression  Dominant Hand Right  [facility] Marguerite Barba, OT/L  [phone] 03/22/2017

## 2017-03-23 LAB — BLOOD CULTURE ID PANEL (REFLEXED)
Acinetobacter baumannii: NOT DETECTED
CANDIDA PARAPSILOSIS: NOT DETECTED
CANDIDA TROPICALIS: NOT DETECTED
Candida albicans: NOT DETECTED
Candida glabrata: NOT DETECTED
Candida krusei: NOT DETECTED
ENTEROCOCCUS SPECIES: NOT DETECTED
Enterobacter cloacae complex: NOT DETECTED
Enterobacteriaceae species: NOT DETECTED
Escherichia coli: NOT DETECTED
HAEMOPHILUS INFLUENZAE: NOT DETECTED
KLEBSIELLA OXYTOCA: NOT DETECTED
Klebsiella pneumoniae: NOT DETECTED
Listeria monocytogenes: NOT DETECTED
Neisseria meningitidis: NOT DETECTED
PROTEUS SPECIES: NOT DETECTED
Pseudomonas aeruginosa: NOT DETECTED
SERRATIA MARCESCENS: NOT DETECTED
STAPHYLOCOCCUS AUREUS BCID: NOT DETECTED
STAPHYLOCOCCUS SPECIES: NOT DETECTED
STREPTOCOCCUS PYOGENES: NOT DETECTED
Streptococcus agalactiae: NOT DETECTED
Streptococcus pneumoniae: NOT DETECTED
Streptococcus species: NOT DETECTED

## 2017-03-23 LAB — BASIC METABOLIC PANEL
Anion gap: 7 (ref 5–15)
BUN: 9 mg/dL (ref 6–20)
CHLORIDE: 102 mmol/L (ref 101–111)
CO2: 27 mmol/L (ref 22–32)
CREATININE: 0.57 mg/dL (ref 0.44–1.00)
Calcium: 8.2 mg/dL — ABNORMAL LOW (ref 8.9–10.3)
GFR calc Af Amer: >60 mL/min (ref >60)
GFR calc non Af Amer: >60 mL/min (ref >60)
GLUCOSE: 99 mg/dL (ref 65–99)
Potassium: 4.2 mmol/L (ref 3.5–5.1)
SODIUM: 136 mmol/L (ref 135–145)

## 2017-03-23 LAB — CBC
HCT: 38.3 % (ref 36.0–46.0)
Hemoglobin: 12.8 g/dL (ref 12.0–15.0)
MCH: 31.7 pg (ref 26.0–34.0)
MCHC: 33.4 g/dL (ref 30.0–36.0)
MCV: 94.8 fL (ref 78.0–100.0)
PLATELETS: 191 10*3/uL (ref 150–400)
RBC: 4.04 MIL/uL (ref 3.87–5.11)
RDW: 12.3 % (ref 11.5–15.5)
WBC: 7.1 10*3/uL (ref 4.0–10.5)

## 2017-03-23 NOTE — Progress Notes (Addendum)
PHARMACY - PHYSICIAN COMMUNICATION CRITICAL VALUE ALERT - BLOOD CULTURE IDENTIFICATION (BCID)   Blood culture update: Gm positive rod in aerobic bottle of 1/2 from 1/30 . Likely contaminant   Name of physician (or Provider) Contacted: [facility]   Changes to prescribed antibiotics required: No changes required   Sharin MonsEmily Veatrice Eckstein, PharmD, BCPS PGY2 Infectious Diseases Pharmacy Resident Pager: [phone]  03/23/2017  11:22 AM

## 2017-03-23 NOTE — Progress Notes (Signed)
PROGRESS NOTE    Allison Marshall  YNW:[id number] DOB: 1926/11/15 DOA: 03/20/2017 PCP: Patient, No Pcp Per    Brief Narrative: 84 y.o. female with medical history significant of Parkinson's Dz, GI bleed, PUD, history of CVA; who initially presented to the hospital for reports of a new lower abdominal mass.  Assessment & Plan:   Principal Problem:   CVA (cerebral vascular accident) (HCC) -  Stroke work up underway. Neurology on board.   DVT on u/s - + dvt started on xarelto  Active Problems:   Parkinson disease (HCC) -  Again neuro on board     Urinary obstruction   Leukocytosis    Community acquired pneumonia - Improving on Azithromycin and Rocephin    Hyperkalemia - Resolved and wnl on last check.    Pressure ulcer of sacrum - continue routine care by nursing.   DVT prophylaxis: started on xarelto Code Status: DNR Family Communication:  None at bedside. Disposition Plan: Pending improvement in condition   Consultants:   none   Procedures: none   Antimicrobials: Azithromycin, Rocephin   Subjective: No new complaints reported to me.  Objective: Vitals:   03/23/17 0155 03/23/17 0543 03/23/17 0907 03/23/17 1342  BP: 103/60 (!) 102/50 (!) 113/59 (!) 111/55  Pulse: 72 70 73 76  Resp: 18 18 18 18   Temp: 98.4 F (36.9 C) 98.1 F (36.7 C) 98.8 F (37.1 C) 98.6 F (37 C)  TempSrc: Axillary Axillary Oral Oral  SpO2: 96% 96% 98% 99%  Weight:      Height:        Intake/Output Summary (Last 24 hours) at 03/23/2017 1501 Last data filed at 03/23/2017 1345 Gross per 24 hour  Intake -  Output 1150 ml  Net -1150 ml   Filed Weights   03/20/17 1919  Weight: 52.2 kg (115 lb)    Examination: Exam unchanged when compared to prior on 03/22/17  General exam: Appears calm and comfortable, in nad. Respiratory system: Clear to auscultation. Respiratory effort normal. Equal chest rise. Cardiovascular system: S1 & S2 heard, RRR. No JVD,  murmurs Gastrointestinal system: Abdomen is nondistended, soft and nontender. No organomegaly or masses felt. Normal bowel sounds heard. Central nervous system: Alert and awake. Answers questions appropriately Extremities: warm no deformities Skin: No rashes, lesions or ulcers Psychiatry:  Mood & affect appropriate.     Data Reviewed: I have personally reviewed following labs and imaging studies  CBC: Recent Labs  Lab 03/20/17 2011 03/20/17 2136 03/21/17 0206 03/23/17 0600  WBC 11.1*  --  9.7 7.1  NEUTROABS 8.3*  --  7.3  --   HGB 15.3* 14.6 13.6 12.8  HCT 45.2 43.0 40.7 38.3  MCV 93.8  --  94.0 94.8  PLT 139*  --  116* 191   Basic Metabolic Panel: Recent Labs  Lab 03/20/17 2011 03/20/17 2136 03/21/17 0206 03/23/17 0600  NA 134* 134* 133* 136  K 6.2* 4.8 4.2 4.2  CL 98* 101 99* 102  CO2 23  --  23 27  GLUCOSE 88 84 132* 99  BUN 33* 34* 28* 9  CREATININE 0.87 0.80 0.79 0.57  CALCIUM 8.6*  --  8.1* 8.2*   GFR: Estimated Creatinine Clearance: 40.1 mL/min (by C-G formula based on SCr of 0.57 mg/dL). Liver Function Tests: Recent Labs  Lab 03/20/17 2011 03/21/17 0206  AST 52* 31  ALT 13* 15  ALKPHOS 64 54  BILITOT 2.2* 1.2  PROT 5.7* 5.1*  ALBUMIN 2.7* 2.4*   Recent  Labs  Lab 03/20/17 2011  LIPASE 24   No results for input(s): AMMONIA in the last 168 hours. Coagulation Profile: Recent Labs  Lab 03/20/17 2011  INR 1.00   Cardiac Enzymes: Recent Labs  Lab 03/21/17 0206  CKTOTAL 48   BNP (last 3 results) No results for input(s): PROBNP in the last 8760 hours. HbA1C: Recent Labs    03/21/17 0207  HGBA1C 5.6   CBG: No results for input(s): GLUCAP in the last 168 hours. Lipid Profile: Recent Labs    03/21/17 0206  CHOL 138  HDL 25*  LDLCALC 88  TRIG 409  CHOLHDL 5.5   Thyroid Function Tests: Recent Labs    03/20/17 2011  TSH 3.304   Anemia Panel: No results for input(s): VITAMINB12, FOLATE, FERRITIN, TIBC, IRON, RETICCTPCT in the  last 72 hours. Sepsis Labs: Recent Labs  Lab 03/20/17 2019 03/20/17 2137  LATICACIDVEN 1.04 1.20    Recent Results (from the past 240 hour(s))  Urine culture     Status: None   Collection Time: 03/20/17  9:00 PM  Result Value Ref Range Status   Specimen Description URINE, RANDOM  Final   Special Requests NONE  Final   Culture   Final    NO GROWTH Performed at [facility], [address], [place], [place] [address]    Report Status 03/22/2017 FINAL  Final  Urine culture     Status: None   Collection Time: 03/20/17 11:13 PM  Result Value Ref Range Status   Specimen Description URINE, CATHETERIZED  Final   Special Requests NONE  Final   Culture   Final    NO GROWTH Performed at [facility], [address] [place], [place] [address]    Report Status 03/22/2017 FINAL  Final  Blood culture (routine x 2)     Status: Abnormal (Preliminary result)   Collection Time: 03/20/17 11:58 PM  Result Value Ref Range Status   Specimen Description BLOOD RIGHT FOREARM  Final   Special Requests   Final    BOTTLES DRAWN AEROBIC AND ANAEROBIC Blood Culture adequate volume   Culture  Setup Time   Final    GRAM POSITIVE COCCI ANAEROBIC BOTTLE ONLY CRITICAL RESULT CALLED TO, READ BACK BY AND VERIFIED WITH: J WEDFORD PHARMD 03/21/17 2340 JDW GRAM POSITIVE RODS AEROBIC BOTTLE ONLY Results Called to: E. SINCLAIR, RPHARMD AT 1120 ON 03/23/17 BY C. JESSUP, MLT. Performed at [facility], [address], [place], [place] [address]    Culture (A)  Final    STAPHYLOCOCCUS SPECIES (COAGULASE NEGATIVE) THE SIGNIFICANCE OF ISOLATING THIS ORGANISM FROM A SINGLE SET OF BLOOD CULTURES WHEN MULTIPLE SETS ARE DRAWN IS UNCERTAIN. PLEASE NOTIFY THE MICROBIOLOGY DEPARTMENT WITHIN ONE WEEK IF SPECIATION AND SENSITIVITIES ARE REQUIRED. GRAM POSITIVE RODS    Report Status PENDING  Incomplete  Blood culture (routine x 2)     Status: None (Preliminary result)   Collection Time: 03/20/17 11:58 PM   Result Value Ref Range Status   Specimen Description BLOOD RIGHT HAND  Final   Special Requests   Final    BOTTLES DRAWN AEROBIC AND ANAEROBIC Blood Culture adequate volume   Culture   Final    NO GROWTH 2 DAYS Performed at [facility], [address] [place], [place] [address]    Report Status PENDING  Incomplete  Blood Culture ID Panel (Reflexed)     Status: Abnormal   Collection Time: 03/20/17 11:58 PM  Result  Value Ref Range Status   Enterococcus species NOT DETECTED NOT DETECTED Final   Listeria monocytogenes NOT DETECTED NOT DETECTED Final   Staphylococcus species DETECTED (A) NOT DETECTED Final    Comment: Methicillin (oxacillin) resistant coagulase negative staphylococcus. Possible blood culture contaminant (unless isolated from more than one blood culture draw or clinical case suggests pathogenicity). No antibiotic treatment is indicated for blood  culture contaminants. CRITICAL RESULT CALLED TO, READ BACK BY AND VERIFIED WITH:  J [facility] PHARMD 03/21/17 2340 JDW    Staphylococcus aureus NOT DETECTED NOT DETECTED Final   Methicillin resistance DETECTED (A) NOT DETECTED Final    Comment: CRITICAL RESULT CALLED TO, READ BACK BY AND VERIFIED WITH:  J [facility] PHARMD 03/21/17 2340 JDW    Streptococcus species NOT DETECTED NOT DETECTED Final   Streptococcus agalactiae NOT DETECTED NOT DETECTED Final   Streptococcus pneumoniae NOT DETECTED NOT DETECTED Final   Streptococcus pyogenes NOT DETECTED NOT DETECTED Final   Acinetobacter baumannii NOT DETECTED NOT DETECTED Final   Enterobacteriaceae species NOT DETECTED NOT DETECTED Final   Enterobacter cloacae complex NOT DETECTED NOT DETECTED Final   Escherichia coli NOT DETECTED NOT DETECTED Final   Klebsiella oxytoca NOT DETECTED NOT DETECTED Final   Klebsiella pneumoniae NOT DETECTED NOT DETECTED Final   Proteus species NOT DETECTED NOT DETECTED Final   Serratia marcescens NOT DETECTED NOT DETECTED Final   Haemophilus  influenzae NOT DETECTED NOT DETECTED Final   Neisseria meningitidis NOT DETECTED NOT DETECTED Final   Pseudomonas aeruginosa NOT DETECTED NOT DETECTED Final   Candida albicans NOT DETECTED NOT DETECTED Final   Candida glabrata NOT DETECTED NOT DETECTED Final   Candida krusei NOT DETECTED NOT DETECTED Final   Candida parapsilosis NOT DETECTED NOT DETECTED Final   Candida tropicalis NOT DETECTED NOT DETECTED Final  Blood Culture ID Panel (Reflexed)     Status: None   Collection Time: 03/20/17 11:58 PM  Result Value Ref Range Status   Enterococcus species NOT DETECTED NOT DETECTED Final   Listeria monocytogenes NOT DETECTED NOT DETECTED Final   Staphylococcus species NOT DETECTED NOT DETECTED Final   Staphylococcus aureus NOT DETECTED NOT DETECTED Final   Streptococcus species NOT DETECTED NOT DETECTED Final   Streptococcus agalactiae NOT DETECTED NOT DETECTED Final   Streptococcus pneumoniae NOT DETECTED NOT DETECTED Final   Streptococcus pyogenes NOT DETECTED NOT DETECTED Final   Acinetobacter baumannii NOT DETECTED NOT DETECTED Final   Enterobacteriaceae species NOT DETECTED NOT DETECTED Final   Enterobacter cloacae complex NOT DETECTED NOT DETECTED Final   Escherichia coli NOT DETECTED NOT DETECTED Final   Klebsiella oxytoca NOT DETECTED NOT DETECTED Final   Klebsiella pneumoniae NOT DETECTED NOT DETECTED Final   Proteus species NOT DETECTED NOT DETECTED Final   Serratia marcescens NOT DETECTED NOT DETECTED Final   Haemophilus influenzae NOT DETECTED NOT DETECTED Final   Neisseria meningitidis NOT DETECTED NOT DETECTED Final   Pseudomonas aeruginosa NOT DETECTED NOT DETECTED Final   Candida albicans NOT DETECTED NOT DETECTED Final   Candida glabrata NOT DETECTED NOT DETECTED Final   Candida krusei NOT DETECTED NOT DETECTED Final   Candida parapsilosis NOT DETECTED NOT DETECTED Final   Candida tropicalis NOT DETECTED NOT DETECTED Final    Comment: Performed at [facility]  Lab, [address], [place], [place] [address]         Radiology Studies: Dg Swallowing Func-speech Pathology  Result Date: 03/22/2017 Objective Swallowing Evaluation: Type of Study: MBS-Modified Barium Swallow Study  Patient Details  Name: Peyton BottomsBertha L Blatchford MRN: [id number] Date of Birth: 06/18/1926 Today's Date: 03/22/2017 Time: SLP Start Time (ACUTE ONLY): 1010 -SLP Stop Time (ACUTE ONLY): 1029 SLP Time Calculation (min) (ACUTE ONLY): 19 min Past Medical History: Past Medical History: Diagnosis Date . GIB (gastrointestinal bleeding)  . Parkinson disease (HCC)  . PUD (peptic ulcer disease)  Past Surgical History: Past Surgical History: Procedure Laterality Date . ESOPHAGOGASTRODUODENOSCOPY   . TEE WITHOUT CARDIOVERSION N/A 03/07/2015  Procedure: TRANSESOPHAGEAL ECHOCARDIOGRAM (TEE);  Surgeon: Wendall StadePeter C Nishan, MD;  [facility];  Service: Cardiovascular;  Laterality: N/A; HPI: Renee HarderBertha L Weatherlyis a 84 y.o.femalewith medical history significant ofParkinson'sDz,GI bleed,PUD, history of CVA;who initially presented to the hospital for reports of a new lower abdominal mass.  Last seen in the emergency department on 1/16, for complaints of lower leg swelling diagnosed with venous stasis discharged home on doxycycline and Lasix. Patient had about 4 days of diarrhea and thereafter has not had any significant bowel movement. Since that time there has been a progressive decline. Family reports that she has not gotten out of bed and has had poor appetite. She has been trying to keep herself hydrated,but does not want much food. Family also noted a sore on her sacrum for which they have been placing cream on to have pill. Other associated symptoms include lethargy, decreased urine output, andmild productive cough with green sputum for the last 2-3 days. CT scan showing new right occipital and posterior temporal lobe infarcts.Also dx wtih CAP vs aspiration PNA with imaging showing signs suggestive of  bilateral lobe PNA, question of UTI.  No Data Recorded Assessment / Plan / Recommendation CHL IP CLINICAL IMPRESSIONS 03/22/2017 Clinical Impression Pt demonstrates a moderate oropharyngeal dysphagia characterized by horizontal positioning of pharynx due to curvature of spine with delay in swallow initiation, likely impaired from baseline age related function due to acute illnesses and deconditioning. If pt is given teaspoon size boluses, thin or thick, she is able to protect airway prior to spillage to trachea, though some silent frank penetration events do occur. Pt ejects penetrate with a cued throat clear. If pt consumes honey thick liquids, regardless of bolus size she is also able to initiate swallow prior to bolus arrival to the vestible. Pts positioning makes cup sips difficult. Pt is able to sip honey via straw, though it is not easy. There is no pharyngeal weakness observed, cough/throat clear is adequately strong if triggered. Depending on goals of care, pt may benefit from honey liquids over a short term to reduce aspiration risk while she is recovering. She could also be given teaspoons of thin water if being assisted by an attentive, trained careguver to cue for an immediate throat clear after each sip. Given family reports of decreasing oral intake and dehydration, these restriction may lead to further medical decline. Will discuss with family.  SLP Visit Diagnosis -- Attention and concentration deficit following -- Frontal lobe and executive function deficit following -- Impact on safety and function --   CHL IP TREATMENT RECOMMENDATION 03/22/2017 Treatment Recommendations Therapy as outlined in treatment plan below   Prognosis 03/22/2017 Prognosis for Safe Diet Advancement Fair Barriers to Reach Goals -- Barriers/Prognosis Comment -- CHL IP DIET RECOMMENDATION 03/22/2017 SLP Diet Recommendations Dysphagia 1 (Puree) solids;Honey thick liquids;Thin liquid Liquid Administration via Spoon;Straw Medication  Administration Crushed with puree Compensations Slow rate;Small sips/bites;Clear throat after each swallow Postural Changes Remain semi-upright after after feeds/meals (Comment)   CHL IP OTHER RECOMMENDATIONS 03/22/2017 Recommended Consults -- Oral Care Recommendations  Oral care BID Other Recommendations Order thickener from pharmacy   CHL IP FOLLOW UP RECOMMENDATIONS 03/22/2017 Follow up Recommendations Skilled Nursing facility   Pipeline [facility] IP FREQUENCY AND DURATION 03/22/2017 Speech Therapy Frequency (ACUTE ONLY) min 2x/week Treatment Duration 2 weeks      CHL IP ORAL PHASE 03/22/2017 Oral Phase Impaired Oral - Pudding Teaspoon -- Oral - Pudding Cup -- Oral - Honey Teaspoon WFL Oral - Honey Cup -- Oral - Nectar Teaspoon WFL Oral - Nectar Cup -- Oral - Nectar Straw WFL Oral - Thin Teaspoon WFL Oral - Thin Cup -- Oral - Thin Straw WFL Oral - Puree WFL Oral - Mech Soft -- Oral - Regular Impaired mastication Oral - Multi-Consistency -- Oral - Pill -- Oral Phase - Comment --  CHL IP PHARYNGEAL PHASE 03/22/2017 Pharyngeal Phase Impaired Pharyngeal- Pudding Teaspoon -- Pharyngeal -- Pharyngeal- Pudding Cup -- Pharyngeal -- Pharyngeal- Honey Teaspoon Delayed swallow initiation-vallecula Pharyngeal -- Pharyngeal- Honey Cup -- Pharyngeal -- Pharyngeal- Nectar Teaspoon Delayed swallow initiation-vallecula;Delayed swallow initiation-pyriform sinuses;Penetration/Aspiration before swallow Pharyngeal Material enters airway, CONTACTS cords and not ejected out;Material does not enter airway Pharyngeal- Nectar Cup -- Pharyngeal -- Pharyngeal- Nectar Straw Penetration/Aspiration before swallow;Delayed swallow initiation-pyriform sinuses Pharyngeal Material enters airway, CONTACTS cords and not ejected out Pharyngeal- Thin Teaspoon Penetration/Aspiration before swallow;Delayed swallow initiation-pyriform sinuses Pharyngeal Material enters airway, remains ABOVE vocal cords and not ejected out;Material does not enter airway Pharyngeal- Thin Cup --  Pharyngeal -- Pharyngeal- Thin Straw Penetration/Aspiration before swallow;Moderate aspiration;Delayed swallow initiation-pyriform sinuses Pharyngeal Material enters airway, passes BELOW cords and not ejected out despite cough attempt by patient Pharyngeal- Puree Delayed swallow initiation-vallecula Pharyngeal -- Pharyngeal- Mechanical Soft -- Pharyngeal -- Pharyngeal- Regular Delayed swallow initiation-vallecula Pharyngeal -- Pharyngeal- Multi-consistency -- Pharyngeal -- Pharyngeal- Pill -- Pharyngeal -- Pharyngeal Comment --  CHL IP CERVICAL ESOPHAGEAL PHASE 03/22/2017 Cervical Esophageal Phase WFL Pudding Teaspoon -- Pudding Cup -- Honey Teaspoon -- Honey Cup -- Nectar Teaspoon -- Nectar Cup -- Nectar Straw -- Thin Teaspoon -- Thin Cup -- Thin Straw -- Puree -- Mechanical Soft -- Regular -- Multi-consistency -- Pill -- Cervical Esophageal Comment -- No flowsheet data found. Harlon Ditty, [place] CCC-SLP [phone] Claudine Mouton 03/22/2017, 10:47 AM                   Scheduled Meds: . pantoprazole  40 mg Oral Daily  . pravastatin  20 mg Oral q1800  . Rivaroxaban  15 mg Oral BID WC   Continuous Infusions: . azithromycin Stopped (03/22/17 2316)  . cefTRIAXone (ROCEPHIN)  IV Stopped (03/22/17 2152)     LOS: 2 days   Time spent: > 35 minutes  Penny Pia, MD [facility] Pager [phone]  If 7PM-7AM, please contact night-coverage [email or web address] Password [facility] 03/23/2017, 3:01 PM

## 2017-03-23 NOTE — Progress Notes (Addendum)
STROKE TEAM PROGRESS NOTE   SUBJECTIVE (INTERVAL HISTORY) Daughter is at the bedside.  Patient is found laying in bed lethargic and sleepy. No acute events reported overnight. Blood culture 1/2 positive, possible contamination.   OBJECTIVE Temp:  [98.1 F (36.7 C)-98.8 F (37.1 C)] 98.6 F (37 C) (02/02 1342) Pulse Rate:  [70-79] 76 (02/02 1342) Cardiac Rhythm: Normal sinus rhythm (02/02 0800) Resp:  [16-18] 18 (02/02 1342) BP: (102-113)/(50-76) 111/55 (02/02 1342) SpO2:  [96 %-100 %] 99 % (02/02 1342)  No results for input(s): GLUCAP in the last 168 hours. Recent Labs  Lab 03/20/17 2011 03/20/17 2136 03/21/17 0206 03/23/17 0600  NA 134* 134* 133* 136  K 6.2* 4.8 4.2 4.2  CL 98* 101 99* 102  CO2 23  --  23 27  GLUCOSE 88 84 132* 99  BUN 33* 34* 28* 9  CREATININE 0.87 0.80 0.79 0.57  CALCIUM 8.6*  --  8.1* 8.2*   Recent Labs  Lab 03/20/17 2011 03/21/17 0206  AST 52* 31  ALT 13* 15  ALKPHOS 64 54  BILITOT 2.2* 1.2  PROT 5.7* 5.1*  ALBUMIN 2.7* 2.4*   Recent Labs  Lab 03/20/17 2011 03/20/17 2136 03/21/17 0206 03/23/17 0600  WBC 11.1*  --  9.7 7.1  NEUTROABS 8.3*  --  7.3  --   HGB 15.3* 14.6 13.6 12.8  HCT 45.2 43.0 40.7 38.3  MCV 93.8  --  94.0 94.8  PLT 139*  --  116* 191   Recent Labs  Lab 03/21/17 0206  CKTOTAL 48   Recent Labs    03/20/17 2011  LABPROT 13.1  INR 1.00   Recent Labs    03/20/17 2114 03/20/17 2250  COLORURINE YELLOW BROWN*  LABSPEC 1.014 1.025  PHURINE 5.0 6.5  GLUCOSEU NEGATIVE NEGATIVE  HGBUR LARGE* LARGE*  BILIRUBINUR NEGATIVE MODERATE*  KETONESUR NEGATIVE 15*  PROTEINUR NEGATIVE 100*  NITRITE NEGATIVE POSITIVE*  LEUKOCYTESUR NEGATIVE MODERATE*       Component Value Date/Time   CHOL 138 03/21/2017 0206   TRIG 125 03/21/2017 0206   HDL 25 (L) 03/21/2017 0206   CHOLHDL 5.5 03/21/2017 0206   VLDL 25 03/21/2017 0206   LDLCALC 88 03/21/2017 0206   Lab Results  Component Value Date   HGBA1C 5.6 03/21/2017   No  results found for: LABOPIA, COCAINSCRNUR, LABBENZ, AMPHETMU, THCU, LABBARB  No results for input(s): ETH in the last 168 hours.  IMAGING  I have personally reviewed the radiological images below and agree with the radiology interpretations.  Dg Chest 2 View 03/20/2017 IMPRESSION:  1. Cardiac enlargement.  No vascular congestion or edema.  2. Nonspecific hilar prominence, particularly on the right. Suggest CT to exclude lymphadenopathy. 3. Emphysematous changes and chronic bronchitic changes in the lungs. No focal consolidation. 4. Aortic atherosclerosis. Electronically Signed   By: Burman Nieves M.D.   On: 03/20/2017 21:46   Ct Head Wo Contrast  Result Date: 03/20/2017 CLINICAL DATA:  Altered level of consciousness. EXAM: CT HEAD WITHOUT CONTRAST TECHNIQUE: Contiguous axial images were obtained from the base of the skull through the vertex without intravenous contrast. COMPARISON:  03/02/2015 FINDINGS: Brain: Infarct again noted in the left posterior parietal lobe as seen on prior CT and MRI. New area of low-density noted in the right occipital and posterior temporal lobes compatible with acute to subacute infarct. No hemorrhage. No hydrocephalus. Vascular: No hyperdense vessel or unexpected calcification. Skull: No acute calvarial abnormality. Sinuses/Orbits: Near complete opacification of the left sphenoid sinus. Mucosal  thickening throughout the remainder of the paranasal sinuses. Mastoids are clear. Other: None IMPRESSION: Stable left posterior parietal infarct. New acute to subacute infarct in the right occipital and posterior temporal lobes. Electronically Signed   By: Charlett Nose M.D.   On: 03/20/2017 22:14   Ct Chest W Contrast  Result Date: 03/20/2017 CLINICAL DATA:  84 y/o F; cough and chills. Mass of the lower abdomen. EXAM: CT CHEST, ABDOMEN, AND PELVIS WITH CONTRAST TECHNIQUE: Multidetector CT imaging of the chest, abdomen and pelvis was performed following the standard protocol  during bolus administration of intravenous contrast. CONTRAST:  ISOVUE-300 IOPAMIDOL (ISOVUE-300) INJECTION 61% COMPARISON:  None. FINDINGS: CT CHEST FINDINGS Cardiovascular: Mild cardiomegaly. Mitral annular calcification. Normal caliber thoracic aorta with mild calcific atherosclerosis. Mildly enlarged main pulmonary artery measuring 3.6 cm. Mediastinum/Nodes: 11 mm nodule within the left lobe of thyroid. No mediastinal adenopathy. Mildly patulous thoracic esophagus. Large hiatal hernia with a large portion of stomach contained in the lower mediastinum. Lungs/Pleura: Bilateral dependent lower lobe airway debris and areas of consolidation compatible with pneumonia, possibly aspiration given distribution. Small bilateral pleural effusions. No pneumothorax. Musculoskeletal: No acute fracture. CT ABDOMEN PELVIS FINDINGS Hepatobiliary: No focal liver abnormality is seen. No gallstones, gallbladder wall thickening, or biliary dilatation. Pancreas: Unremarkable. No pancreatic ductal dilatation or surrounding inflammatory changes. Spleen: Normal in size without focal abnormality. Adrenals/Urinary Tract: Adrenal glands are unremarkable. Multiple renal cysts measuring up to 21 mm in the right kidney. Kidneys otherwise are normal, without renal calculi, focal lesion, or hydronephrosis. Bladder collapsed around Foley catheter. Stomach/Bowel: Stomach is within normal limits. Appendix not identified, no pericecal inflammation. No evidence of bowel wall thickening, distention, or inflammatory changes. Large volume of stool in the rectal vault. Vascular/Lymphatic: Aortic atherosclerosis. No enlarged abdominal or pelvic lymph nodes. Reproductive: Status post hysterectomy. No adnexal masses. Other: No abdominal wall hernia or abnormality. No abdominopelvic ascites. Musculoskeletal: No fracture is seen. Advanced spondylosis of the lumbar spine. Multifactorial probable severe canal stenosis at the L3-4 level. IMPRESSION: 1.  Bilateral dependent lower lobe airway debris and consolidation compatible with pneumonia, possibly aspiration given distribution. Small bilateral effusions. 2. Mild cardiomegaly. 3. Enlarged main pulmonary artery may represent pulmonary artery hypertension. 4. No acute process of abdomen or pelvis identified. 5. Advanced spondylosis of lumbar spine with probable multifactorial severe L3-4 canal stenosis. Electronically Signed   By: Mitzi Hansen M.D.   On: 03/20/2017 23:25   Mr Maxine Glenn Head Wo Contrast  Result Date: 03/21/2017 CLINICAL DATA:  84 y/o  F; stroke for follow-up. EXAM: MRI HEAD WITHOUT CONTRAST MRA HEAD WITHOUT CONTRAST TECHNIQUE: Multiplanar, multiecho pulse sequences of the brain and surrounding structures were obtained without intravenous contrast. Angiographic images of the head were obtained using MRA technique without contrast. COMPARISON:  03/20/2017 CT head.  03/03/2015 MRI head and MRA head. FINDINGS: MRI HEAD FINDINGS Brain: Large right PCA distribution infarct with mildly reduced diffusion, T2 FLAIR hyperintensity, and local mass effect, likely late acute/early subacute. Small foci of susceptibility hypointensity with T1 shortening are compatible with petechial hemorrhage. Partial effacement of the atria of right lateral ventricle. Several additional punctate infarcts are present within the bilateral frontal and parietal cortices and the right cerebellum with similar diffusion, also likely late acute/early subacute. Stable chronic infarction in the left lateral parietal lobe. Stable moderate chronic microvascular ischemic changes of white matter and parenchymal volume loss of the brain. Vascular: As below. Skull and upper cervical spine: Normal marrow signal. Sinuses/Orbits: No abnormal signal of mastoid air cells.  Bilateral intra-ocular lens replacement. Moderate paranasal sinus mucosal thickening and left sphenoid sinus opacification. Other: None. MRA HEAD FINDINGS Internal  carotid arteries:  Patent. Anterior cerebral arteries:  Patent. Middle cerebral arteries: Patent. Anterior communicating artery: Patent. Posterior communicating arteries:  Patent. Posterior cerebral arteries:  Patent. Basilar artery:  Patent. Vertebral arteries:  Patent. No evidence of high-grade stenosis, large vessel occlusion, or aneurysm. IMPRESSION: 1. Large late acute/early subacute infarction in right PCA territory with petechial hemorrhage. Mild local mass effect. No herniation. 2. Additional punctate infarcts are present in bilateral frontal and parietal cortices as well as right cerebellar hemisphere. 3. Patent anterior and posterior intracranial circulation. No large vessel occlusion, aneurysm, or significant stenosis is identified. 4. Stable chronic microvascular ischemic changes and parenchymal volume loss of the brain. Stable small chronic infarct in left lateral parietal lobe. Electronically Signed   By: Mitzi HansenLance  Furusawa-Stratton M.D.   On: 03/21/2017 03:37   Mr Brain Wo Contrast  Result Date: 03/21/2017 CLINICAL DATA:  84 y/o  F; stroke for follow-up. EXAM: MRI HEAD WITHOUT CONTRAST MRA HEAD WITHOUT CONTRAST TECHNIQUE: Multiplanar, multiecho pulse sequences of the brain and surrounding structures were obtained without intravenous contrast. Angiographic images of the head were obtained using MRA technique without contrast. COMPARISON:  03/20/2017 CT head.  03/03/2015 MRI head and MRA head. FINDINGS: MRI HEAD FINDINGS Brain: Large right PCA distribution infarct with mildly reduced diffusion, T2 FLAIR hyperintensity, and local mass effect, likely late acute/early subacute. Small foci of susceptibility hypointensity with T1 shortening are compatible with petechial hemorrhage. Partial effacement of the atria of right lateral ventricle. Several additional punctate infarcts are present within the bilateral frontal and parietal cortices and the right cerebellum with similar diffusion, also likely late  acute/early subacute. Stable chronic infarction in the left lateral parietal lobe. Stable moderate chronic microvascular ischemic changes of white matter and parenchymal volume loss of the brain. Vascular: As below. Skull and upper cervical spine: Normal marrow signal. Sinuses/Orbits: No abnormal signal of mastoid air cells. Bilateral intra-ocular lens replacement. Moderate paranasal sinus mucosal thickening and left sphenoid sinus opacification. Other: None. MRA HEAD FINDINGS Internal carotid arteries:  Patent. Anterior cerebral arteries:  Patent. Middle cerebral arteries: Patent. Anterior communicating artery: Patent. Posterior communicating arteries:  Patent. Posterior cerebral arteries:  Patent. Basilar artery:  Patent. Vertebral arteries:  Patent. No evidence of high-grade stenosis, large vessel occlusion, or aneurysm. IMPRESSION: 1. Large late acute/early subacute infarction in right PCA territory with petechial hemorrhage. Mild local mass effect. No herniation. 2. Additional punctate infarcts are present in bilateral frontal and parietal cortices as well as right cerebellar hemisphere. 3. Patent anterior and posterior intracranial circulation. No large vessel occlusion, aneurysm, or significant stenosis is identified. 4. Stable chronic microvascular ischemic changes and parenchymal volume loss of the brain. Stable small chronic infarct in left lateral parietal lobe. Electronically Signed   By: Mitzi HansenLance  Furusawa-Stratton M.D.   On: 03/21/2017 03:37   Ct Abdomen Pelvis W Contrast  Result Date: 03/20/2017 CLINICAL DATA:  84 y/o F; cough and chills. Mass of the lower abdomen. EXAM: CT CHEST, ABDOMEN, AND PELVIS WITH CONTRAST TECHNIQUE: Multidetector CT imaging of the chest, abdomen and pelvis was performed following the standard protocol during bolus administration of intravenous contrast. CONTRAST:  100mL ISOVUE-300 IOPAMIDOL (ISOVUE-300) INJECTION 61% COMPARISON:  None. FINDINGS: CT CHEST FINDINGS  Cardiovascular: Mild cardiomegaly. Mitral annular calcification. Normal caliber thoracic aorta with mild calcific atherosclerosis. Mildly enlarged main pulmonary artery measuring 3.6 cm. Mediastinum/Nodes: 11 mm nodule within the left lobe  of thyroid. No mediastinal adenopathy. Mildly patulous thoracic esophagus. Large hiatal hernia with a large portion of stomach contained in the lower mediastinum. Lungs/Pleura: Bilateral dependent lower lobe airway debris and areas of consolidation compatible with pneumonia, possibly aspiration given distribution. Small bilateral pleural effusions. No pneumothorax. Musculoskeletal: No acute fracture. CT ABDOMEN PELVIS FINDINGS Hepatobiliary: No focal liver abnormality is seen. No gallstones, gallbladder wall thickening, or biliary dilatation. Pancreas: Unremarkable. No pancreatic ductal dilatation or surrounding inflammatory changes. Spleen: Normal in size without focal abnormality. Adrenals/Urinary Tract: Adrenal glands are unremarkable. Multiple renal cysts measuring up to 21 mm in the right kidney. Kidneys otherwise are normal, without renal calculi, focal lesion, or hydronephrosis. Bladder collapsed around Foley catheter. Stomach/Bowel: Stomach is within normal limits. Appendix not identified, no pericecal inflammation. No evidence of bowel wall thickening, distention, or inflammatory changes. Large volume of stool in the rectal vault. Vascular/Lymphatic: Aortic atherosclerosis. No enlarged abdominal or pelvic lymph nodes. Reproductive: Status post hysterectomy. No adnexal masses. Other: No abdominal wall hernia or abnormality. No abdominopelvic ascites. Musculoskeletal: No fracture is seen. Advanced spondylosis of the lumbar spine. Multifactorial probable severe canal stenosis at the L3-4 level. IMPRESSION: 1. Bilateral dependent lower lobe airway debris and consolidation compatible with pneumonia, possibly aspiration given distribution. Small bilateral effusions. 2. Mild  cardiomegaly. 3. Enlarged main pulmonary artery may represent pulmonary artery hypertension. 4. No acute process of abdomen or pelvis identified. 5. Advanced spondylosis of lumbar spine with probable multifactorial severe L3-4 canal stenosis. Electronically Signed   By: Mitzi Hansen M.D.   On: 03/20/2017 23:25   Carotid Doppler  Right Carotid: The extracranial vessels were near-normal with only minimal wall thickening or plaque. Left Carotid: The extracranial vessels were near-normal with only minimal wall thickening or plaque. Vertebrals: Both vertebral arteries were patent with antegrade flow. Subclavians: Normal flow hemodynamics were seen in bilateral subclavian arteries.  TTE  - Left ventricle: The cavity size was normal. Wall thickness was   normal. Systolic function was normal. The estimated ejection   fraction was in the range of 55% to 60%. Left ventricular   diastolic function parameters were normal. - Aortic valve: There was mild regurgitation. - Left atrium: Significant shadowing artifact in LA but cannot r/o   LA mass suggest TEE The atrium was mildly dilated. - Right ventricle: The cavity size was severely dilated. - Right atrium: The atrium was severely dilated. - Atrial septum: Septum not well seen however the basal aspect   appears aneursyms and likely large PFO or possible venosous ASD   suggest bubble study   or f/u TEE. - Tricuspid valve: There was moderate regurgitation. - Pulmonary arteries: PA peak pressure: 60 mm Hg (S).  LE venous doppler  Right: There is evidence of acute, non occlusive DVT in the Common Femoral vein, and proximal Femoral vein. Left: There is evidence of occluisve acute DVT in the Common Femoral vein, Femoral vein, proximal Profunda vein, Popliteal vein, Posterior Tibial veins, and Peroneal veins, and in the external iliac vein. The IVC is patent   PHYSICAL EXAM  Temp:  [98.1 F (36.7 C)-98.8 F (37.1 C)] 98.6 F (37 C)  (02/02 1342) Pulse Rate:  [70-79] 76 (02/02 1342) Resp:  [16-18] 18 (02/02 1342) BP: (102-113)/(50-76) 111/55 (02/02 1342) SpO2:  [96 %-100 %] 99 % (02/02 1342)  General - Well nourished, well developed, lethargic.  Ophthalmologic - fundi not visualized due to noncooperation.  Cardiovascular - Regular rate and rhythm.  Mental Status -  Level of arousal and  orientation to place, and person were intact, but not orientated to time. Language exam showed paucity of speech, moderate dysarthria, able to name 2/2 and repeat sentences. Follows all simple commands  Cranial Nerves II - XII - II - Visual field intact OU. III, IV, VI - Extraocular movements intact. V - Facial sensation intact bilaterally. VII - right nasolabial fold flattening. VIII - Hearing & vestibular intact bilaterally. X - Palate elevates symmetrically. XI - Chin turning & shoulder shrug intact bilaterally. XII - Tongue protrusion intact.  Motor Strength - The patient's strength was 4/5 BUEs and 3-/5 BLEs with bilateral LE swelling and pronator drift was absent.  Bulk was normal and fasciculations were absent.   Motor Tone - Muscle tone was assessed at the neck and appendages and was normal.  Reflexes - The patient's reflexes were symmetrical in all extremities and she had no pathological reflexes.  Sensory - Light touch, temperature/pinprick were assessed and were symmetrical.    Coordination - The patient had normal movements in the hands with no ataxia or dysmetria, but slow motion.  Tremor was present at lip and tongue with mild resting tremor b/l arm.  Gait and Station - deferred.   ASSESSMENT/PLAN Ms. Allison Marshall is a 84 y.o. female with history of tremors, GI bleeding in 2011, stroke 2017, gradual weight loss admitted for progressive decline, leg swelling in 2 weeks. No tPA given due to out of window.    Stroke:  right MCA and PCA moderate sized, right cerebellum and left periventricular punctate  infarcts embolic patter secondary to paradoxical emboli with LE DVT and large PFO.  Resultant lethargy and physical decline  MRI right PCA and right inferior MCA acute infarct, right cerebellum, left periventricular white matter punctate infarcts, and left MCA remote infarcts  MRA  Head negative  Carotid Doppler  negative  2D Echo EF 55-60%  LE venous doppler - bilateral LE acute DVT  Pan CT ruled out malignancy  LDL 88  HgbA1c 5.6  Xarelto for VTE prophylaxis Fall precautions Aspiration precautions  DIET - DYS 1 Room service appropriate? Yes; Fluid consistency: Honey Thick   aspirin 81 mg daily prior to admission, now on Xarelto (rivaroxaban) bid for DVT and stroke treatment.   Patient counseled to be compliant with her antithrombotic medications  Ongoing aggressive stroke risk factor management  Therapy recommendations:  SNF  Disposition:  Pending  PFO  Large PFO reviewed in 02/2015 with TEE during stroke workup  Current stroke likely due to DVT in the setting of PFO  Bilateral DVT  Confirmed on LE venous Doppler  Consistent with history of bilateral lower extremity edema  On Xarelto for treatment  Bacteremia / sepsis  Anaerobic bottle grew gram-positive cocci  1/2 positive, likely contamination  BP on the low side, hold off propranolol  History of stroke  02/2015 - speech difficulty - left MCA infarct, negative DVT, carotid Doppler negative, EF 60-65% but found to have abnormal mobile linear density on the aortic side of the aortic valve, TEE showed large PFO, EF 60, mild AR with small lambl's excresence.   Hyperlipidemia  Home meds:  Pravastatin not taking   LDL 88, goal < 70  Now on pravastatin 20  Continue statin at discharge  Other Stroke Risk Factors  Advanced age  Other Active Problems  Pneumonia - on azithromycin  UTI - on rocephin  Tremor - was on propranolol 80mg  at home but recommend to hold off due to low BP  Hospital  day # 2  Neurology will sign off. Please call with questions. Pt will follow up with Darrol Angel, NP, at [facility] in about 6 weeks. Thanks for the consult.   Marvel Plan, MD PhD Stroke Neurology 03/24/2017 12:14 AM     To contact Stroke Continuity provider, please refer to [email or web address]. After hours, contact General Neurology

## 2017-03-23 NOTE — Progress Notes (Signed)
  Speech Language Pathology Treatment: Dysphagia  Patient Details Name: Allison BottomsBertha L Marshall MRN: [id number] DOB: 07/09/1926 Today's Date: 03/23/2017 Time: [id number] SLP Time Calculation (min) (ACUTE ONLY): 16 min  Assessment / Plan / Recommendation Clinical Impression  Patient seen for f/u diet tolerance assessment and caregiver education. Daughter in room. Education provided regarding results of MBS, diet recommendations, aspiration precautions, compensatory strategies, and thickening procedures as well as prognosis and plan. Skilled observation with patient self feeding cup sips of honey thick liquid and pureed solid revealed no overt indication of aspiration. Overall, current diet remaining appropriate. SNF SLP to f/u. SLP will f/u while patient inpatient.    HPI HPI: Allison HarderBertha L Weatherlyis a 84 y.o.femalewith medical history significant ofParkinson'sDz,GI bleed,PUD, history of CVA;who initially presented to the hospital for reports of a new lower abdominal mass.  Last seen in the emergency department on 1/16, for complaints of lower leg swelling diagnosed with venous stasis discharged home on doxycycline and Lasix. Patient had about 4 days of diarrhea and thereafter has not had any significant bowel movement. Since that time there has been a progressive decline. Family reports that she has not gotten out of bed and has had poor appetite. She has been trying to keep herself hydrated,but does not want much food. Family also noted a sore on her sacrum for which they have been placing cream on to have pill. Other associated symptoms include lethargy, decreased urine output, andmild productive cough with green sputum for the last 2-3 days. CT scan showing new right occipital and posterior temporal lobe infarcts.Also dx wtih CAP vs aspiration PNA with imaging showing signs suggestive of bilateral lobe PNA, question of UTI.       SLP Plan  Continue with current plan of care        Recommendations  Diet recommendations: Dysphagia 1 (puree);Honey-thick liquid Liquids provided via: Cup Medication Administration: Whole meds with puree Supervision: Patient able to self feed;Full supervision/cueing for compensatory strategies;Staff to assist with self feeding Compensations: Slow rate;Small sips/bites;Clear throat after each swallow Postural Changes and/or Swallow Maneuvers: Seated upright 90 degrees                Oral Care Recommendations: Oral care BID Follow up Recommendations: Skilled Nursing facility SLP Visit Diagnosis: Dysphagia, oropharyngeal phase (R13.12) Plan: Continue with current plan of care                   [facility] Alva Broxson MA, CCC-SLP [phone]    Shanele Nissan Meryl 03/23/2017, 2:32 PM

## 2017-03-24 DIAGNOSIS — R5383 Other fatigue: Secondary | ICD-10-CM

## 2017-03-24 LAB — CULTURE, BLOOD (ROUTINE X 2): SPECIAL REQUESTS: ADEQUATE

## 2017-03-24 LAB — LEGIONELLA PNEUMOPHILA SEROGP 1 UR AG: L. pneumophila Serogp 1 Ur Ag: NEGATIVE

## 2017-03-24 MED ORDER — RIVAROXABAN (XARELTO) VTE STARTER PACK (15 & 20 MG): ORAL_TABLET | ORAL | 0 refills | Status: AC

## 2017-03-24 MED ORDER — DOXYCYCLINE HYCLATE 100 MG PO CAPS: 100.0000 mg | ORAL_CAPSULE | Freq: Two times a day (BID) | ORAL | 0 refills | Status: AC

## 2017-03-24 MED ORDER — AZITHROMYCIN 500 MG PO TABS
500.0000 mg | ORAL_TABLET | Freq: Every day | ORAL | Status: DC
  Administered 2017-03-24: 500 mg via ORAL
  Filled 2017-03-24: qty 1

## 2017-03-24 MED ORDER — PROPRANOLOL HCL 10 MG PO TABS: 10.0000 mg | ORAL_TABLET | Freq: Two times a day (BID) | ORAL | 0 refills | Status: AC

## 2017-03-24 NOTE — Clinical Social Work Note (Addendum)
Clinical Social Work Assessment  Patient Details  Name: Allison Marshall MRN: [id number] Date of Birth: 1927-01-23  Date of referral:  03/24/17               Reason for consult:  Facility Placement                Permission sought to share information with:  Family Supports(Adult Children) Permission granted to share information::  Yes, Verbal Permission Granted  Name::     Belenda Cruise, [phone] and Elaina Hoops, [phone]  Agency::     Relationship::  Adult Children  Contact Information:     Housing/Transportation Living arrangements for the past 2 months:   Home Source of Information:  Patient, Adult Children Patient Interpreter Needed:  None Criminal Activity/Legal Involvement Pertinent to Current Situation/Hospitalization:  No - Comment as needed Significant Relationships:  Adult Children Lives with:   Self, up until two months ago when family members began providing patient with care Do you feel safe going back to the place where you live?  Yes Need for family participation in patient care:  No (Coment)  Care giving concerns: Patient was admitted to [facility] for pneumonia and an ulcer. Patient has Parkinson's also. Per notes and history from family, patient was able to complete all ADL's up until about two months ago, family members began providing her with care.   Social Worker assessment / plan:  CSW met with patient at bedside to attempt assessment for SNF placement. Patient was asleep upon CSW arrival, CSW called patient's name and she awoke. Patient stated that she would like CSW to call her daughter's to discuss placement. CSW spoke with patient's daughter Golden Circle to discuss placement. Patient originally desired [facility] in [place] but they are not accepting patients today due to it being Sunday. Patient's daughter Golden Circle was provided with bed offers but expressed that she felt pressed to make a decision that she was not ready for. CSW contacted RN CM to  meet with family and discuss potential process, however RN CM had conversation with daughter and patient and they agreed upon placement at [facility]. Whenever CSW contacted Santiago Glad at [facility], Santiago Glad did not confirm that patient could be placed there at this time and stated that she would get back to CSW at approximately 7pm. CSW spoke with Dr. Wendee Beavers to confirm that patient could leave hospital and go to facility on 03/25/16.  Employment status:  Retired Forensic scientist:  Medicare PT Recommendations:  [place] / Referral to community resources:  [place]  Patient/Family's Response to care:  Patient and her daughter Golden Circle were present during interaction at bedside. Golden Circle stated that she felt pressed to make a decision that she was not prepared for, especially without her sister.   Patient/Family's Understanding of and Emotional Response to Diagnosis, Current Treatment, and Prognosis:  Patient and daughter aware of plan for discharge to [facility] on 03/25/16.  Emotional Assessment Appearance:  Appears stated age Attitude/Demeanor/Rapport:  Gracious Affect (typically observed):  Accepting, Appropriate, Pleasant Orientation:  Oriented to Place, Oriented to Situation, Oriented to Self, Oriented to  Time Alcohol / Substance use:  Not Applicable Psych involvement (Current and /or in the community):  No (Comment)  Discharge Needs  Concerns to be addressed:    Readmission within the last 30 days:  No Current discharge risk:    Barriers to Discharge:   No SNF bed.   Archie Endo, [place] 03/24/2017, 2:42 PM

## 2017-03-24 NOTE — Care Management Important Message (Signed)
Important Message  Patient Details  Name: Allison BottomsBertha L Marshall MRN: [id number] Date of Birth: 09/20/1926   Medicare Important Message Given:  Yes    Lawerance Sabalebbie Gwendolyn Nishi, RN 03/24/2017, 3:45 PM

## 2017-03-24 NOTE — Discharge Summary (Addendum)
Physician Discharge Summary  RICARDA ATAYDE ZOX:[id number] DOB: 02-23-1926 DOA: 03/20/2017  PCP: Patient, No Pcp Per  Admit date: 03/20/2017 Discharge date: 03/25/2017  Time spent: > 35 minutes  Recommendations for Outpatient Follow-up:  1. If blood pressures soft would hold beta blocker. Continue to monitor blood pressure facility   Discharge Diagnoses:  Principal Problem:   CVA (cerebral vascular accident) [facility]) Active Problems:   Parkinson disease (HCC)   Urinary obstruction   Leukocytosis   Community acquired pneumonia   Hyperkalemia   Pressure ulcer of sacrum   Deep venous thrombosis (HCC)   Acute cystitis without hematuria   Bacteremia   Sepsis due to Escherichia coli (HCC)   Fatigue   Discharge Condition: stable  Diet recommendation: dysphagia 1 diet  Filed Weights   03/20/17 1919  Weight: 52.2 kg (115 lb)    History of present illness:  Ms. LORAINE BHULLAR is a 84 y.o. female with history of tremors, GI bleeding in 2011, stroke 2017, gradual weight loss admitted for progressive decline, leg swelling in 2 weeks. No tPA given due to out of window  Hospital Course:  Per neurology: Stroke:  right MCA and PCA moderate sized, right cerebellum and left periventricular punctate infarcts embolic patter secondary to paradoxical emboli with LE DVT and large PFO.  Resultant lethargy and physical decline  MRI right PCA and right inferior MCA acute infarct, right cerebellum, left periventricular white matter punctate infarcts, and left MCA remote infarcts  MRA  Head negative  Carotid Doppler  negative  2D Echo EF 55-60%  LE venous doppler - bilateral LE acute DVT  Pan CT ruled out malignancy  LDL 88  HgbA1c 5.6  Xarelto for VTE prophylaxis  Fall precautions  Aspiration precautions  DIET - DYS 1 Room service appropriate? Yes; Fluid consistency: Honey Thick   aspirin 81 mg daily prior to admission, now on Xarelto (rivaroxaban) bid for DVT and stroke  treatment.   Patient counseled to be compliant with her antithrombotic medications  Ongoing aggressive stroke risk factor management  Therapy recommendations:  SNF  Disposition:  Pending  PFO  Large PFO reviewed in 02/2015 with TEE during stroke workup  Current stroke likely due to DVT in the setting of PFO  Bilateral DVT  Confirmed on LE venous Doppler  Consistent with history of bilateral lower extremity edema  On Xarelto for treatment  Bacteremia / sepsis  Anaerobic bottle grew gram-positive cocci  1/2 positive, likely contamination  BP on the low side, hold off propranolol  History of stroke  02/2015 - speech difficulty - left MCA infarct, negative DVT, carotid Doppler negative, EF 60-65% but found to have abnormal mobile linear density on the aortic side of the aortic valve, TEE showed large PFO, EF 60, mild AR with small lambl's excresence.   Hyperlipidemia  Home meds:  Pravastatin not taking   LDL 88, goal < 70  Now on pravastatin 20  Continue statin at discharge  Other Stroke Risk Factors  Advanced age  Other Active Problems  Pneumonia - on azithromycin  UTI - on rocephin  HTN -  I will continue propranolol in the next 2 days order placed and reduced dose. Pt had some atrial flutter but currently heart rate within normal limits. We'll continue propranolol more for rate control moving forward rather than blood pressure control as patient's blood pressure has been  within normal limits  Procedures:  Stroke w/u please refer to Neurology notes  Consultations:  Neurology  Discharge Exam:  Vitals:   03/24/17 1019 03/24/17 1433  BP: (!) 100/46 (!) 118/51  Pulse: 78 75  Resp: 19 20  Temp: 98.7 F (37.1 C) 98 F (36.7 C)  SpO2: 98% 99%    General: Pt in nad, alert and awake Cardiovascular: rrr, no rubs Respiratory: no increased wob, no wheezes  Discharge Instructions   Discharge Instructions    Ambulatory referral to Neurology    Complete by:  As directed    An appointment is requested in approximately: 6 weeks Follow up with stroke clinic Darrol Angel preferred, if not available, then consider Sylvie Farrier, [facility] or Lucia Gaskins whoever is available) at [facility] in about 6-8 weeks. Thanks.   Call MD for:  difficulty breathing, headache or visual disturbances   Complete by:  As directed    Call MD for:  temperature >100.4   Complete by:  As directed    Diet - low sodium heart healthy   Complete by:  As directed    Discharge instructions   Complete by:  As directed    Please ensure f/u with neurologist after hospital discharge. Continue blood pressure medication and beta blocker 2 days after discharge if tolerated. Monitor blood pressures and heart rate   Increase activity slowly   Complete by:  As directed      Allergies as of 03/24/2017      Reactions   Influenza Vac Split [flu Virus Vaccine] Other (See Comments)   Family reports that patient had paralysis with flu vaccine      Medication List    STOP taking these medications   aspirin 325 MG tablet   aspirin EC 81 MG tablet   furosemide 20 MG tablet Commonly known as:  LASIX   sulfamethoxazole-trimethoprim 800-160 MG tablet Commonly known as:  BACTRIM DS,SEPTRA DS     TAKE these medications   acetaminophen 325 MG tablet Commonly known as:  TYLENOL Take 325-650 mg by mouth every 6 (six) hours as needed for moderate pain.   doxycycline 100 MG capsule Commonly known as:  VIBRAMYCIN Take 1 capsule (100 mg total) by mouth 2 (two) times daily for 3 days.   pantoprazole 40 MG tablet Commonly known as:  PROTONIX Take 1 tablet (40 mg total) by mouth daily.   pravastatin 20 MG tablet Commonly known as:  PRAVACHOL Take 1 tablet (20 mg total) by mouth daily at 6 PM.   propranolol 10 MG tablet Commonly known as:  INDERAL Take 1 tablet (10 mg total) by mouth every 12 (twelve) hours. Start taking on:  03/26/2017 What changed:    medication strength  how  much to take  These instructions start on 03/26/2017. If you are unsure what to do until then, ask your doctor or other care provider.   Rivaroxaban 15 & 20 MG Tbpk Take as directed on package: Start with one 15mg  tablet by mouth twice a day with food. On Day 22, switch to one 20mg  tablet once a day with food.      Allergies  Allergen Reactions  . Influenza Vac Split [Flu Virus Vaccine] Other (See Comments)    Family reports that patient had paralysis with flu vaccine   Follow-up Information    Nilda Riggs, NP. Schedule an appointment as soon as possible for a visit in 6 week(s).   Specialty:  Family Medicine Contact information: [address] [place] [address] [phone]            The results of significant diagnostics  from this hospitalization (including imaging, microbiology, ancillary and laboratory) are listed below for reference.    Significant Diagnostic Studies: Dg Chest 2 View  Result Date: 03/20/2017 CLINICAL DATA:  Altered mental status. Productive cough. Not feeling well since last Thursday. Mass on the lower abdomen. EXAM: CHEST  2 VIEW COMPARISON:  03/02/2015 FINDINGS: Cardiac enlargement. Bilateral hilar enlargement, greatest on the right. This may represent prominent pulmonary arteries due to pulmonary arterial hypertension but lymphadenopathy is also possible. Suggest CT chest for further evaluation. Emphysematous changes and scattered fibrosis in the lungs. No airspace disease or consolidation. Esophageal hiatal hernia behind the heart. Blunting of the costophrenic angles suggesting bilateral effusions or thickened pleura. Calcification of the aorta. Degenerative changes in the spine. IMPRESSION: 1. Cardiac enlargement.  No vascular congestion or edema. 2. Nonspecific hilar prominence, particularly on the right. Suggest CT to exclude lymphadenopathy. 3. Emphysematous changes and chronic bronchitic changes in the lungs. No focal  consolidation. 4. Aortic atherosclerosis. Electronically Signed   By: Burman Nieves M.D.   On: 03/20/2017 21:46   Ct Head Wo Contrast  Result Date: 03/20/2017 CLINICAL DATA:  Altered level of consciousness. EXAM: CT HEAD WITHOUT CONTRAST TECHNIQUE: Contiguous axial images were obtained from the base of the skull through the vertex without intravenous contrast. COMPARISON:  03/02/2015 FINDINGS: Brain: Infarct again noted in the left posterior parietal lobe as seen on prior CT and MRI. New area of low-density noted in the right occipital and posterior temporal lobes compatible with acute to subacute infarct. No hemorrhage. No hydrocephalus. Vascular: No hyperdense vessel or unexpected calcification. Skull: No acute calvarial abnormality. Sinuses/Orbits: Near complete opacification of the left sphenoid sinus. Mucosal thickening throughout the remainder of the paranasal sinuses. Mastoids are clear. Other: None IMPRESSION: Stable left posterior parietal infarct. New acute to subacute infarct in the right occipital and posterior temporal lobes. Electronically Signed   By: Charlett Nose M.D.   On: 03/20/2017 22:14   Ct Chest W Contrast  Result Date: 03/20/2017 CLINICAL DATA:  84 y/o F; cough and chills. Mass of the lower abdomen. EXAM: CT CHEST, ABDOMEN, AND PELVIS WITH CONTRAST TECHNIQUE: Multidetector CT imaging of the chest, abdomen and pelvis was performed following the standard protocol during bolus administration of intravenous contrast. CONTRAST:  ISOVUE-300 IOPAMIDOL (ISOVUE-300) INJECTION 61% COMPARISON:  None. FINDINGS: CT CHEST FINDINGS Cardiovascular: Mild cardiomegaly. Mitral annular calcification. Normal caliber thoracic aorta with mild calcific atherosclerosis. Mildly enlarged main pulmonary artery measuring 3.6 cm. Mediastinum/Nodes: 11 mm nodule within the left lobe of thyroid. No mediastinal adenopathy. Mildly patulous thoracic esophagus. Large hiatal hernia with a large portion of stomach  contained in the lower mediastinum. Lungs/Pleura: Bilateral dependent lower lobe airway debris and areas of consolidation compatible with pneumonia, possibly aspiration given distribution. Small bilateral pleural effusions. No pneumothorax. Musculoskeletal: No acute fracture. CT ABDOMEN PELVIS FINDINGS Hepatobiliary: No focal liver abnormality is seen. No gallstones, gallbladder wall thickening, or biliary dilatation. Pancreas: Unremarkable. No pancreatic ductal dilatation or surrounding inflammatory changes. Spleen: Normal in size without focal abnormality. Adrenals/Urinary Tract: Adrenal glands are unremarkable. Multiple renal cysts measuring up to 21 mm in the right kidney. Kidneys otherwise are normal, without renal calculi, focal lesion, or hydronephrosis. Bladder collapsed around Foley catheter. Stomach/Bowel: Stomach is within normal limits. Appendix not identified, no pericecal inflammation. No evidence of bowel wall thickening, distention, or inflammatory changes. Large volume of stool in the rectal vault. Vascular/Lymphatic: Aortic atherosclerosis. No enlarged abdominal or pelvic lymph nodes. Reproductive: Status post hysterectomy. No adnexal masses.  Other: No abdominal wall hernia or abnormality. No abdominopelvic ascites. Musculoskeletal: No fracture is seen. Advanced spondylosis of the lumbar spine. Multifactorial probable severe canal stenosis at the L3-4 level. IMPRESSION: 1. Bilateral dependent lower lobe airway debris and consolidation compatible with pneumonia, possibly aspiration given distribution. Small bilateral effusions. 2. Mild cardiomegaly. 3. Enlarged main pulmonary artery may represent pulmonary artery hypertension. 4. No acute process of abdomen or pelvis identified. 5. Advanced spondylosis of lumbar spine with probable multifactorial severe L3-4 canal stenosis. Electronically Signed   By: Mitzi Hansen M.D.   On: 03/20/2017 23:25   Mr Maxine Glenn Head Wo Contrast  Result Date:  03/21/2017 CLINICAL DATA:  84 y/o  F; stroke for follow-up. EXAM: MRI HEAD WITHOUT CONTRAST MRA HEAD WITHOUT CONTRAST TECHNIQUE: Multiplanar, multiecho pulse sequences of the brain and surrounding structures were obtained without intravenous contrast. Angiographic images of the head were obtained using MRA technique without contrast. COMPARISON:  03/20/2017 CT head.  03/03/2015 MRI head and MRA head. FINDINGS: MRI HEAD FINDINGS Brain: Large right PCA distribution infarct with mildly reduced diffusion, T2 FLAIR hyperintensity, and local mass effect, likely late acute/early subacute. Small foci of susceptibility hypointensity with T1 shortening are compatible with petechial hemorrhage. Partial effacement of the atria of right lateral ventricle. Several additional punctate infarcts are present within the bilateral frontal and parietal cortices and the right cerebellum with similar diffusion, also likely late acute/early subacute. Stable chronic infarction in the left lateral parietal lobe. Stable moderate chronic microvascular ischemic changes of white matter and parenchymal volume loss of the brain. Vascular: As below. Skull and upper cervical spine: Normal marrow signal. Sinuses/Orbits: No abnormal signal of mastoid air cells. Bilateral intra-ocular lens replacement. Moderate paranasal sinus mucosal thickening and left sphenoid sinus opacification. Other: None. MRA HEAD FINDINGS Internal carotid arteries:  Patent. Anterior cerebral arteries:  Patent. Middle cerebral arteries: Patent. Anterior communicating artery: Patent. Posterior communicating arteries:  Patent. Posterior cerebral arteries:  Patent. Basilar artery:  Patent. Vertebral arteries:  Patent. No evidence of high-grade stenosis, large vessel occlusion, or aneurysm. IMPRESSION: 1. Large late acute/early subacute infarction in right PCA territory with petechial hemorrhage. Mild local mass effect. No herniation. 2. Additional punctate infarcts are present in  bilateral frontal and parietal cortices as well as right cerebellar hemisphere. 3. Patent anterior and posterior intracranial circulation. No large vessel occlusion, aneurysm, or significant stenosis is identified. 4. Stable chronic microvascular ischemic changes and parenchymal volume loss of the brain. Stable small chronic infarct in left lateral parietal lobe. Electronically Signed   By: Mitzi Hansen M.D.   On: 03/21/2017 03:37   Mr Brain Wo Contrast  Result Date: 03/21/2017 CLINICAL DATA:  84 y/o  F; stroke for follow-up. EXAM: MRI HEAD WITHOUT CONTRAST MRA HEAD WITHOUT CONTRAST TECHNIQUE: Multiplanar, multiecho pulse sequences of the brain and surrounding structures were obtained without intravenous contrast. Angiographic images of the head were obtained using MRA technique without contrast. COMPARISON:  03/20/2017 CT head.  03/03/2015 MRI head and MRA head. FINDINGS: MRI HEAD FINDINGS Brain: Large right PCA distribution infarct with mildly reduced diffusion, T2 FLAIR hyperintensity, and local mass effect, likely late acute/early subacute. Small foci of susceptibility hypointensity with T1 shortening are compatible with petechial hemorrhage. Partial effacement of the atria of right lateral ventricle. Several additional punctate infarcts are present within the bilateral frontal and parietal cortices and the right cerebellum with similar diffusion, also likely late acute/early subacute. Stable chronic infarction in the left lateral parietal lobe. Stable moderate chronic microvascular ischemic changes of  white matter and parenchymal volume loss of the brain. Vascular: As below. Skull and upper cervical spine: Normal marrow signal. Sinuses/Orbits: No abnormal signal of mastoid air cells. Bilateral intra-ocular lens replacement. Moderate paranasal sinus mucosal thickening and left sphenoid sinus opacification. Other: None. MRA HEAD FINDINGS Internal carotid arteries:  Patent. Anterior cerebral  arteries:  Patent. Middle cerebral arteries: Patent. Anterior communicating artery: Patent. Posterior communicating arteries:  Patent. Posterior cerebral arteries:  Patent. Basilar artery:  Patent. Vertebral arteries:  Patent. No evidence of high-grade stenosis, large vessel occlusion, or aneurysm. IMPRESSION: 1. Large late acute/early subacute infarction in right PCA territory with petechial hemorrhage. Mild local mass effect. No herniation. 2. Additional punctate infarcts are present in bilateral frontal and parietal cortices as well as right cerebellar hemisphere. 3. Patent anterior and posterior intracranial circulation. No large vessel occlusion, aneurysm, or significant stenosis is identified. 4. Stable chronic microvascular ischemic changes and parenchymal volume loss of the brain. Stable small chronic infarct in left lateral parietal lobe. Electronically Signed   By: Mitzi Hansen M.D.   On: 03/21/2017 03:37   Ct Abdomen Pelvis W Contrast  Result Date: 03/20/2017 CLINICAL DATA:  84 y/o F; cough and chills. Mass of the lower abdomen. EXAM: CT CHEST, ABDOMEN, AND PELVIS WITH CONTRAST TECHNIQUE: Multidetector CT imaging of the chest, abdomen and pelvis was performed following the standard protocol during bolus administration of intravenous contrast. CONTRAST:  ISOVUE-300 IOPAMIDOL (ISOVUE-300) INJECTION 61% COMPARISON:  None. FINDINGS: CT CHEST FINDINGS Cardiovascular: Mild cardiomegaly. Mitral annular calcification. Normal caliber thoracic aorta with mild calcific atherosclerosis. Mildly enlarged main pulmonary artery measuring 3.6 cm. Mediastinum/Nodes: 11 mm nodule within the left lobe of thyroid. No mediastinal adenopathy. Mildly patulous thoracic esophagus. Large hiatal hernia with a large portion of stomach contained in the lower mediastinum. Lungs/Pleura: Bilateral dependent lower lobe airway debris and areas of consolidation compatible with pneumonia, possibly aspiration given  distribution. Small bilateral pleural effusions. No pneumothorax. Musculoskeletal: No acute fracture. CT ABDOMEN PELVIS FINDINGS Hepatobiliary: No focal liver abnormality is seen. No gallstones, gallbladder wall thickening, or biliary dilatation. Pancreas: Unremarkable. No pancreatic ductal dilatation or surrounding inflammatory changes. Spleen: Normal in size without focal abnormality. Adrenals/Urinary Tract: Adrenal glands are unremarkable. Multiple renal cysts measuring up to 21 mm in the right kidney. Kidneys otherwise are normal, without renal calculi, focal lesion, or hydronephrosis. Bladder collapsed around Foley catheter. Stomach/Bowel: Stomach is within normal limits. Appendix not identified, no pericecal inflammation. No evidence of bowel wall thickening, distention, or inflammatory changes. Large volume of stool in the rectal vault. Vascular/Lymphatic: Aortic atherosclerosis. No enlarged abdominal or pelvic lymph nodes. Reproductive: Status post hysterectomy. No adnexal masses. Other: No abdominal wall hernia or abnormality. No abdominopelvic ascites. Musculoskeletal: No fracture is seen. Advanced spondylosis of the lumbar spine. Multifactorial probable severe canal stenosis at the L3-4 level. IMPRESSION: 1. Bilateral dependent lower lobe airway debris and consolidation compatible with pneumonia, possibly aspiration given distribution. Small bilateral effusions. 2. Mild cardiomegaly. 3. Enlarged main pulmonary artery may represent pulmonary artery hypertension. 4. No acute process of abdomen or pelvis identified. 5. Advanced spondylosis of lumbar spine with probable multifactorial severe L3-4 canal stenosis. Electronically Signed   By: Mitzi Hansen M.D.   On: 03/20/2017 23:25   Dg Swallowing Func-speech Pathology  Result Date: 03/22/2017 Objective Swallowing Evaluation: Type of Study: MBS-Modified Barium Swallow Study  Patient Details Name: RYA RAUSCH MRN: [id number] Date of Birth:  1927/02/16 Today's Date: 03/22/2017 Time: SLP Start Time (ACUTE ONLY): 1010 -SLP Stop Time (  ACUTE ONLY): 1029 SLP Time Calculation (min) (ACUTE ONLY): 19 min Past Medical History: Past Medical History: Diagnosis Date . GIB (gastrointestinal bleeding)  . Parkinson disease (HCC)  . PUD (peptic ulcer disease)  Past Surgical History: Past Surgical History: Procedure Laterality Date . ESOPHAGOGASTRODUODENOSCOPY   . TEE WITHOUT CARDIOVERSION N/A 03/07/2015  Procedure: TRANSESOPHAGEAL ECHOCARDIOGRAM (TEE);  Surgeon: Wendall Stade, MD;  Location: [facility] ENDOSCOPY;  Service: Cardiovascular;  Laterality: N/A; HPI: [name] a 84 y.o.femalewith medical history significant ofParkinson'sDz,GI bleed,PUD, history of CVA;who initially presented to the hospital for reports of a new lower abdominal mass.  Last seen in the emergency department on 1/16, for complaints of lower leg swelling diagnosed with venous stasis discharged home on doxycycline and Lasix. Patient had about 4 days of diarrhea and thereafter has not had any significant bowel movement. Since that time there has been a progressive decline. Family reports that she has not gotten out of bed and has had poor appetite. She has been trying to keep herself hydrated,but does not want much food. Family also noted a sore on her sacrum for which they have been placing cream on to have pill. Other associated symptoms include lethargy, decreased urine output, andmild productive cough with green sputum for the last 2-3 days. CT scan showing new right occipital and posterior temporal lobe infarcts.Also dx wtih CAP vs aspiration PNA with imaging showing signs suggestive of bilateral lobe PNA, question of UTI.  No Data Recorded Assessment / Plan / Recommendation CHL IP CLINICAL IMPRESSIONS 03/22/2017 Clinical Impression Pt demonstrates a moderate oropharyngeal dysphagia characterized by horizontal positioning of pharynx due to curvature of spine with delay in  swallow initiation, likely impaired from baseline age related function due to acute illnesses and deconditioning. If pt is given teaspoon size boluses, thin or thick, she is able to protect airway prior to spillage to trachea, though some silent frank penetration events do occur. Pt ejects penetrate with a cued throat clear. If pt consumes honey thick liquids, regardless of bolus size she is also able to initiate swallow prior to bolus arrival to the vestible. Pts positioning makes cup sips difficult. Pt is able to sip honey via straw, though it is not easy. There is no pharyngeal weakness observed, cough/throat clear is adequately strong if triggered. Depending on goals of care, pt may benefit from honey liquids over a short term to reduce aspiration risk while she is recovering. She could also be given teaspoons of thin water if being assisted by an attentive, trained careguver to cue for an immediate throat clear after each sip. Given family reports of decreasing oral intake and dehydration, these restriction may lead to further medical decline. Will discuss with family.  SLP Visit Diagnosis -- Attention and concentration deficit following -- Frontal lobe and executive function deficit following -- Impact on safety and function --   CHL IP TREATMENT RECOMMENDATION 03/22/2017 Treatment Recommendations Therapy as outlined in treatment plan below   Prognosis 03/22/2017 Prognosis for Safe Diet Advancement Fair Barriers to Reach Goals -- Barriers/Prognosis Comment -- CHL IP DIET RECOMMENDATION 03/22/2017 SLP Diet Recommendations Dysphagia 1 (Puree) solids;Honey thick liquids;Thin liquid Liquid Administration via Spoon;Straw Medication Administration Crushed with puree Compensations Slow rate;Small sips/bites;Clear throat after each swallow Postural Changes Remain semi-upright after after feeds/meals (Comment)   CHL IP OTHER RECOMMENDATIONS 03/22/2017 Recommended Consults -- Oral Care Recommendations Oral care BID Other  Recommendations Order thickener from pharmacy   CHL IP FOLLOW UP RECOMMENDATIONS 03/22/2017 Follow up Recommendations Skilled Nursing facility  CHL IP FREQUENCY AND DURATION 03/22/2017 Speech Therapy Frequency (ACUTE ONLY) min 2x/week Treatment Duration 2 weeks      CHL IP ORAL PHASE 03/22/2017 Oral Phase Impaired Oral - Pudding Teaspoon -- Oral - Pudding Cup -- Oral - Honey Teaspoon WFL Oral - Honey Cup -- Oral - Nectar Teaspoon WFL Oral - Nectar Cup -- Oral - Nectar Straw WFL Oral - Thin Teaspoon WFL Oral - Thin Cup -- Oral - Thin Straw WFL Oral - Puree WFL Oral - Mech Soft -- Oral - Regular Impaired mastication Oral - Multi-Consistency -- Oral - Pill -- Oral Phase - Comment --  CHL IP PHARYNGEAL PHASE 03/22/2017 Pharyngeal Phase Impaired Pharyngeal- Pudding Teaspoon -- Pharyngeal -- Pharyngeal- Pudding Cup -- Pharyngeal -- Pharyngeal- Honey Teaspoon Delayed swallow initiation-vallecula Pharyngeal -- Pharyngeal- Honey Cup -- Pharyngeal -- Pharyngeal- Nectar Teaspoon Delayed swallow initiation-vallecula;Delayed swallow initiation-pyriform sinuses;Penetration/Aspiration before swallow Pharyngeal Material enters airway, CONTACTS cords and not ejected out;Material does not enter airway Pharyngeal- Nectar Cup -- Pharyngeal -- Pharyngeal- Nectar Straw Penetration/Aspiration before swallow;Delayed swallow initiation-pyriform sinuses Pharyngeal Material enters airway, CONTACTS cords and not ejected out Pharyngeal- Thin Teaspoon Penetration/Aspiration before swallow;Delayed swallow initiation-pyriform sinuses Pharyngeal Material enters airway, remains ABOVE vocal cords and not ejected out;Material does not enter airway Pharyngeal- Thin Cup -- Pharyngeal -- Pharyngeal- Thin Straw Penetration/Aspiration before swallow;Moderate aspiration;Delayed swallow initiation-pyriform sinuses Pharyngeal Material enters airway, passes BELOW cords and not ejected out despite cough attempt by patient Pharyngeal- Puree Delayed swallow  initiation-vallecula Pharyngeal -- Pharyngeal- Mechanical Soft -- Pharyngeal -- Pharyngeal- Regular Delayed swallow initiation-vallecula Pharyngeal -- Pharyngeal- Multi-consistency -- Pharyngeal -- Pharyngeal- Pill -- Pharyngeal -- Pharyngeal Comment --  CHL IP CERVICAL ESOPHAGEAL PHASE 03/22/2017 Cervical Esophageal Phase WFL Pudding Teaspoon -- Pudding Cup -- Honey Teaspoon -- Honey Cup -- Nectar Teaspoon -- Nectar Cup -- Nectar Straw -- Thin Teaspoon -- Thin Cup -- Thin Straw -- Puree -- Mechanical Soft -- Regular -- Multi-consistency -- Pill -- Cervical Esophageal Comment -- No flowsheet data found. Harlon Ditty, [place] CCC-SLP [phone] Claudine Mouton 03/22/2017, 10:47 AM               Microbiology: Recent Results (from the past 240 hour(s))  Urine culture     Status: None   Collection Time: 03/20/17  9:00 PM  Result Value Ref Range Status   Specimen Description URINE, RANDOM  Final   Special Requests NONE  Final   Culture   Final    NO GROWTH Performed at [facility], [address], [place], [place] [address]    Report Status 03/22/2017 FINAL  Final  Urine culture     Status: None   Collection Time: 03/20/17 11:13 PM  Result Value Ref Range Status   Specimen Description URINE, CATHETERIZED  Final   Special Requests NONE  Final   Culture   Final    NO GROWTH Performed at [facility], [address], [place], [place] [address]    Report Status 03/22/2017 FINAL  Final  Blood culture (routine x 2)     Status: Abnormal   Collection Time: 03/20/17 11:58 PM  Result Value Ref Range Status   Specimen Description BLOOD RIGHT FOREARM  Final   Special Requests   Final    BOTTLES DRAWN AEROBIC AND ANAEROBIC Blood Culture adequate volume   Culture  Setup Time   Final    GRAM POSITIVE COCCI ANAEROBIC BOTTLE ONLY CRITICAL RESULT CALLED TO, READ BACK BY AND VERIFIED WITH: J [facility]  PHARMD 03/21/17 2340 JDW GRAM POSITIVE RODS AEROBIC BOTTLE ONLY Results Called to: E.  SINCLAIR, RPHARMD AT 1120 ON 03/23/17 BY C. JESSUP, MLT.    Culture (A)  Final    STAPHYLOCOCCUS SPECIES (COAGULASE NEGATIVE) THE SIGNIFICANCE OF ISOLATING THIS ORGANISM FROM A SINGLE SET OF BLOOD CULTURES WHEN MULTIPLE SETS ARE DRAWN IS UNCERTAIN. PLEASE NOTIFY THE MICROBIOLOGY DEPARTMENT WITHIN ONE WEEK IF SPECIATION AND SENSITIVITIES ARE REQUIRED. DIPHTHEROIDS(CORYNEBACTERIUM SPECIES) Standardized susceptibility testing for this organism is not available. Performed at [facility], [address], [place], [place] [address]    Report Status 03/24/2017 FINAL  Final  Blood culture (routine x 2)     Status: None (Preliminary result)   Collection Time: 03/20/17 11:58 PM  Result Value Ref Range Status   Specimen Description BLOOD RIGHT HAND  Final   Special Requests   Final    BOTTLES DRAWN AEROBIC AND ANAEROBIC Blood Culture adequate volume   Culture   Final    NO GROWTH 2 DAYS Performed at [facility], [address] [place], [place] [address]    Report Status PENDING  Incomplete  Blood Culture ID Panel (Reflexed)     Status: Abnormal   Collection Time: 03/20/17 11:58 PM  Result Value Ref Range Status   Enterococcus species NOT DETECTED NOT DETECTED Final   Listeria monocytogenes NOT DETECTED NOT DETECTED Final   Staphylococcus species DETECTED (A) NOT DETECTED Final    Comment: Methicillin (oxacillin) resistant coagulase negative staphylococcus. Possible blood culture contaminant (unless isolated from more than one blood culture draw or clinical case suggests pathogenicity). No antibiotic treatment is indicated for blood  culture contaminants. CRITICAL RESULT CALLED TO, READ BACK BY AND VERIFIED WITH:  J [facility] PHARMD 03/21/17 2340 JDW    Staphylococcus aureus NOT DETECTED NOT DETECTED Final   Methicillin resistance DETECTED (A) NOT DETECTED Final    Comment: CRITICAL RESULT CALLED TO, READ BACK BY AND VERIFIED WITH:  J [facility] PHARMD 03/21/17 2340 JDW     Streptococcus species NOT DETECTED NOT DETECTED Final   Streptococcus agalactiae NOT DETECTED NOT DETECTED Final   Streptococcus pneumoniae NOT DETECTED NOT DETECTED Final   Streptococcus pyogenes NOT DETECTED NOT DETECTED Final   Acinetobacter baumannii NOT DETECTED NOT DETECTED Final   Enterobacteriaceae species NOT DETECTED NOT DETECTED Final   Enterobacter cloacae complex NOT DETECTED NOT DETECTED Final   Escherichia coli NOT DETECTED NOT DETECTED Final   Klebsiella oxytoca NOT DETECTED NOT DETECTED Final   Klebsiella pneumoniae NOT DETECTED NOT DETECTED Final   Proteus species NOT DETECTED NOT DETECTED Final   Serratia marcescens NOT DETECTED NOT DETECTED Final   Haemophilus influenzae NOT DETECTED NOT DETECTED Final   Neisseria meningitidis NOT DETECTED NOT DETECTED Final   Pseudomonas aeruginosa NOT DETECTED NOT DETECTED Final   Candida albicans NOT DETECTED NOT DETECTED Final   Candida glabrata NOT DETECTED NOT DETECTED Final   Candida krusei NOT DETECTED NOT DETECTED Final   Candida parapsilosis NOT DETECTED NOT DETECTED Final   Candida tropicalis NOT DETECTED NOT DETECTED Final  Blood Culture ID Panel (Reflexed)     Status: None   Collection Time: 03/20/17 11:58 PM  Result Value Ref Range Status   Enterococcus species NOT DETECTED NOT DETECTED Final   Listeria monocytogenes NOT DETECTED NOT DETECTED Final   Staphylococcus species NOT DETECTED NOT DETECTED Final   Staphylococcus aureus NOT DETECTED NOT DETECTED Final   Streptococcus species NOT DETECTED NOT DETECTED Final   Streptococcus agalactiae NOT  DETECTED NOT DETECTED Final   Streptococcus pneumoniae NOT DETECTED NOT DETECTED Final   Streptococcus pyogenes NOT DETECTED NOT DETECTED Final   Acinetobacter baumannii NOT DETECTED NOT DETECTED Final   Enterobacteriaceae species NOT DETECTED NOT DETECTED Final   Enterobacter cloacae complex NOT DETECTED NOT DETECTED Final   Escherichia coli NOT DETECTED NOT DETECTED Final    Klebsiella oxytoca NOT DETECTED NOT DETECTED Final   Klebsiella pneumoniae NOT DETECTED NOT DETECTED Final   Proteus species NOT DETECTED NOT DETECTED Final   Serratia marcescens NOT DETECTED NOT DETECTED Final   Haemophilus influenzae NOT DETECTED NOT DETECTED Final   Neisseria meningitidis NOT DETECTED NOT DETECTED Final   Pseudomonas aeruginosa NOT DETECTED NOT DETECTED Final   Candida albicans NOT DETECTED NOT DETECTED Final   Candida glabrata NOT DETECTED NOT DETECTED Final   Candida krusei NOT DETECTED NOT DETECTED Final   Candida parapsilosis NOT DETECTED NOT DETECTED Final   Candida tropicalis NOT DETECTED NOT DETECTED Final    Comment: Performed at [facility], [address] [place], [place] [address]     Labs: Basic Metabolic Panel: Recent Labs  Lab 03/20/17 2011 03/20/17 2136 03/21/17 0206 03/23/17 0600  NA 134* 134* 133* 136  K 6.2* 4.8 4.2 4.2  CL 98* 101 99* 102  CO2 23  --  23 27  GLUCOSE 88 84 132* 99  BUN 33* 34* 28* 9  CREATININE 0.87 0.80 0.79 0.57  CALCIUM 8.6*  --  8.1* 8.2*   Liver Function Tests: Recent Labs  Lab 03/20/17 2011 03/21/17 0206  AST 52* 31  ALT 13* 15  ALKPHOS 64 54  BILITOT 2.2* 1.2  PROT 5.7* 5.1*  ALBUMIN 2.7* 2.4*   Recent Labs  Lab 03/20/17 2011  LIPASE 24   No results for input(s): AMMONIA in the last 168 hours. CBC: Recent Labs  Lab 03/20/17 2011 03/20/17 2136 03/21/17 0206 03/23/17 0600  WBC 11.1*  --  9.7 7.1  NEUTROABS 8.3*  --  7.3  --   HGB 15.3* 14.6 13.6 12.8  HCT 45.2 43.0 40.7 38.3  MCV 93.8  --  94.0 94.8  PLT 139*  --  116* 191   Cardiac Enzymes: Recent Labs  Lab 03/21/17 0206  CKTOTAL 48   BNP: BNP (last 3 results) Recent Labs    03/20/17 2011  BNP 194.9*    ProBNP (last 3 results) No results for input(s): PROBNP in the last 8760 hours.  CBG: No results for input(s): GLUCAP in the last 168 hours.     Signed:  Penny Piarlando Ravina Milner MD.  [facility] 03/24/2017, 3:07  PM

## 2017-03-24 NOTE — Progress Notes (Signed)
Central telemetry notified nurse of a reading of flutter. Notified MD. Lawson RadarHeather M Maedell Hedger

## 2017-03-25 MED ORDER — DOCUSATE SODIUM 100 MG PO CAPS
100.0000 mg | ORAL_CAPSULE | Freq: Two times a day (BID) | ORAL | Status: DC
  Administered 2017-03-25: 100 mg via ORAL
  Filled 2017-03-25: qty 1

## 2017-03-25 NOTE — Progress Notes (Addendum)
  Speech Language Pathology Treatment: Dysphagia  Patient Details Name: Allison BottomsBertha L Marshall MRN: [id number] DOB: 03/19/1926 Today's Date: 03/25/2017 Time: [id number] SLP Time Calculation (min) (ACUTE ONLY): 17 min  Assessment / Plan / Recommendation Clinical Impression  Followed up with pt and two daughters to reinforce need for short term honey thick liquids as pt progresses. Discussed rationale for teaspoons of water with an intermittent throat clear. Demonstrated safe spoon feeding and cues and benefits of plain water. Patient able to follow cues for throat clear with enthusiasm. Had to ask her to clear her throat LESS often. Pt to d/c with puree and honey diet with teaspoons of water if fully upright with assist for teaspoon sips and cues for a throat clear intermittently. Vocal quality and speech fully clear today, pt is improving.   HPI HPI: Allison HarderBertha L Weatherlyis a 84 y.o.femalewith medical history significant ofParkinson'sDz,GI bleed,PUD, history of CVA;who initially presented to the hospital for reports of a new lower abdominal mass.  Last seen in the emergency department on 1/16, for complaints of lower leg swelling diagnosed with venous stasis discharged home on doxycycline and Lasix. Patient had about 4 days of diarrhea and thereafter has not had any significant bowel movement. Since that time there has been a progressive decline. Family reports that she has not gotten out of bed and has had poor appetite. She has been trying to keep herself hydrated,but does not want much food. Family also noted a sore on her sacrum for which they have been placing cream on to have pill. Other associated symptoms include lethargy, decreased urine output, andmild productive cough with green sputum for the last 2-3 days. CT scan showing new right occipital and posterior temporal lobe infarcts.Also dx wtih CAP vs aspiration PNA with imaging showing signs suggestive of bilateral lobe PNA, question of  UTI.       SLP Plan  Continue with current plan of care       Recommendations  Diet recommendations: Dysphagia 1 (puree);Honey-thick liquid;Thin liquid(thin liquids with family only, via teaspoon) Liquids provided via: Cup;Teaspoon Medication Administration: Whole meds with puree Supervision: Patient able to self feed;Full supervision/cueing for compensatory strategies;Trained caregiver to feed patient Compensations: Slow rate;Small sips/bites;Clear throat intermittently Postural Changes and/or Swallow Maneuvers: Seated upright 90 degrees                Oral Care Recommendations: Oral care BID Follow up Recommendations: Skilled Nursing facility SLP Visit Diagnosis: Dysphagia, oropharyngeal phase (R13.12) Plan: Continue with current plan of care       GO               [facility] Gilbert[facility]nie Jahnyla Parrillo, MA CCC-SLP [phone]  Allison Marshall, Allison Marshall 03/25/2017, 1:49 PM

## 2017-03-25 NOTE — Clinical Social Work Placement (Signed)
Nurse to call report to [phone], Room 101B    CLINICAL SOCIAL WORK PLACEMENT  NOTE  Date:  03/25/2017  Patient Details  Name: Allison Marshall MRN: [id number] Date of Birth: 05/12/1926  Clinical Social Work is seeking post-discharge placement for this patient at the Skilled  Nursing Facility level of care (*CSW will initial, date and re-position this form in  chart as items are completed):  Yes   Patient/family provided with [facility] Clinical Social Work Department's list of facilities offering this level of care within the geographic area requested by the patient (or if unable, by the patient's family).  Yes   Patient/family informed of their freedom to choose among providers that offer the needed level of care, that participate in Medicare, Medicaid or managed care program needed by the patient, have an available bed and are willing to accept the patient.  Yes   Patient/family informed of [facility]'s ownership interest in [facility] and [facility], as well as of the fact that they are under no obligation to receive care at these facilities.  PASRR submitted to EDS on 03/22/17     PASRR number received on 03/22/17     Existing PASRR number confirmed on       FL2 transmitted to all facilities in geographic area requested by pt/family on 03/22/17     FL2 transmitted to all facilities within larger geographic area on       Patient informed that his/her managed care company has contracts with or will negotiate with certain facilities, including the following:        Yes   Patient/family informed of bed offers received.  Patient chooses bed at [facility] [place]     Physician recommends and patient chooses bed at      Patient to be transferred to [facility] [place] on 03/25/17.  Patient to be transferred to facility by [facility]     Patient family notified on 03/25/17 of transfer.  Name of family member notified:  Pearletha AlfredPeggy, Libby     PHYSICIAN        Additional Comment:    _______________________________________________ Baldemar LenisElizabeth M Kenyona Rena, LCSW 03/25/2017, 11:58 AM

## 2017-03-25 NOTE — Progress Notes (Signed)
Report called into [facility]. Patient and family updated.

## 2017-03-25 NOTE — Progress Notes (Signed)
Foley cathter removed per MD order. Patient due to void @ 5:30 pm (03/25/2016).

## 2017-03-26 LAB — CULTURE, BLOOD (ROUTINE X 2)
Culture: NO GROWTH
SPECIAL REQUESTS: ADEQUATE

## 2017-08-26 ENCOUNTER — Emergency Department (HOSPITAL_COMMUNITY)

## 2017-08-26 ENCOUNTER — Emergency Department (HOSPITAL_COMMUNITY)
Admission: EM | Admit: 2017-08-26 | Discharge: 2017-08-27 | Disposition: A | Discharge status: Home / Self Care | Attending: Emergency Medicine | Admitting: Emergency Medicine

## 2017-08-26 ENCOUNTER — Other Ambulatory Visit: Payer: Self-pay

## 2017-08-26 DIAGNOSIS — K449 Diaphragmatic hernia without obstruction or gangrene: Secondary | ICD-10-CM | POA: Diagnosis not present

## 2017-08-26 DIAGNOSIS — Z7902 Long term (current) use of antithrombotics/antiplatelets: Secondary | ICD-10-CM | POA: Insufficient documentation

## 2017-08-26 DIAGNOSIS — N390 Urinary tract infection, site not specified: Secondary | ICD-10-CM | POA: Insufficient documentation

## 2017-08-26 DIAGNOSIS — G20 Parkinson's disease: Secondary | ICD-10-CM | POA: Insufficient documentation

## 2017-08-26 DIAGNOSIS — R319 Hematuria, unspecified: Secondary | ICD-10-CM

## 2017-08-26 DIAGNOSIS — K920 Hematemesis: Secondary | ICD-10-CM | POA: Diagnosis present

## 2017-08-26 LAB — ABO/RH: ABO/RH(D): A POS

## 2017-08-26 LAB — URINALYSIS, ROUTINE W REFLEX MICROSCOPIC
Bilirubin Urine: NEGATIVE
Glucose, UA: NEGATIVE mg/dL
Ketones, ur: 20 mg/dL — AB
Nitrite: NEGATIVE
Protein, ur: 100 mg/dL — AB
Specific Gravity, Urine: 1.019 (ref 1.005–1.030)
WBC, UA: >50 WBC/hpf — ABNORMAL HIGH (ref 0–5)
pH: 6 (ref 5.0–8.0)

## 2017-08-26 LAB — TYPE AND SCREEN
ABO/RH(D): A POS
ANTIBODY SCREEN: NEGATIVE

## 2017-08-26 LAB — CBC
HCT: 37 % (ref 36.0–46.0)
Hemoglobin: 12.1 g/dL (ref 12.0–15.0)
MCH: 31.1 pg (ref 26.0–34.0)
MCHC: 32.7 g/dL (ref 30.0–36.0)
MCV: 95.1 fL (ref 78.0–100.0)
PLATELETS: 269 10*3/uL (ref 150–400)
RBC: 3.89 MIL/uL (ref 3.87–5.11)
RDW: 12.7 % (ref 11.5–15.5)
WBC: 11.5 10*3/uL — AB (ref 4.0–10.5)

## 2017-08-26 LAB — COMPREHENSIVE METABOLIC PANEL
ALT: 16 U/L (ref 0–44)
AST: 19 U/L (ref 15–41)
Albumin: 3.2 g/dL — ABNORMAL LOW (ref 3.5–5.0)
Alkaline Phosphatase: 69 U/L (ref 38–126)
Anion gap: 17 — ABNORMAL HIGH (ref 5–15)
BILIRUBIN TOTAL: 1 mg/dL (ref 0.3–1.2)
BUN: 35 mg/dL — AB (ref 8–23)
CO2: 26 mmol/L (ref 22–32)
Calcium: 9.6 mg/dL (ref 8.9–10.3)
Chloride: 101 mmol/L (ref 98–111)
Creatinine, Ser: 0.68 mg/dL (ref 0.44–1.00)
Glucose, Bld: 154 mg/dL — ABNORMAL HIGH (ref 70–99)
POTASSIUM: 3.4 mmol/L — AB (ref 3.5–5.1)
Sodium: 144 mmol/L (ref 135–145)
TOTAL PROTEIN: 7.2 g/dL (ref 6.5–8.1)

## 2017-08-26 LAB — POC OCCULT BLOOD, ED: Fecal Occult Bld: NEGATIVE

## 2017-08-26 MED ORDER — SUCRALFATE 1 G PO TABS: 1.0000 g | ORAL_TABLET | Freq: Three times a day (TID) | ORAL | 0 refills | Status: AC

## 2017-08-26 MED ORDER — LEVOFLOXACIN 500 MG PO TABS: 500.0000 mg | ORAL_TABLET | Freq: Every day | ORAL | 0 refills | Status: AC

## 2017-08-26 NOTE — ED Notes (Signed)
PA informed that pt family requesting an update.

## 2017-08-26 NOTE — Progress Notes (Signed)
[facility]ia[facility] RN visit.  Visited with patient at bedside in ED, daughters, Almyra FreeLibby and Gigi Gineggy were present. They stated patient had been vomiting a brownish liquid and were concerned she maybe bleeding. Patient was alert and oriented and did not appear in distress, denied pain. VS: BP 119/81, HR 89, resp 19, SpO2 93% on room air. Her lips and nail beds are pink. She has a foley catheter with cloudy yellow urine. Daughters state patient had recently complete antibiotics for a UTI.   Abnormal labs: K+ 3.4, Glucose 154, BUN 35, Albumin 3.2, WBC 11.5.  Patient was waiting on ED provider to assess. The patient states she wants to go home.   [facility] will continue to follow and anticipate discharge. Please contact hospice with any hospice related questions or concerns.   If patient is discharge home please call [facility] for transport.   Thank you,  Elsie SaasMary Anne Robertson, RN, CCM [facility] hospital liaison [phone]  [facility] hospital liaisons are on AMION.

## 2017-08-26 NOTE — ED Notes (Signed)
RN has spoken to PA x3 about speaking with Pt and family. Awaiting PA to speak with family

## 2017-08-26 NOTE — Discharge Instructions (Addendum)
You were given a prescription for antibiotics. Please take the antibiotic prescription fully.   I have prescribed a new medication for you today. It is important that when you pick the prescription up you discuss the potential interactions of this medication with other medications you are taking, including over the counter medications, with the pharmacists.   This new medication has potential side effects. Be sure to contact your primary care provider or return to the emergency department if you are experiencing new symptoms that you are unable to tolerate after starting the medication. You need to receive medical evaluation immediately if you start to experience blistering of the skin, rash, swelling, or difficulty breathing as these signs could indicate a more serious medication side effect.   Please follow up with your primary care provider within 5-7 days for re-evaluation of your symptoms. You were given a referral to a gastroenterology doctor. Please call the gastroenterology doctor to follow up with them in the next 1-2 weeks. Please return to the emergency department for any new or worsening symptoms.

## 2017-08-26 NOTE — ED Notes (Signed)
RN has informed PA x2 that pt's family requesting an update.  Pt's family beginning to get irritated

## 2017-08-26 NOTE — ED Provider Notes (Signed)
[facility]-EMERGENCY DEPT Provider Note   CSN: [id number] Arrival date & time: 08/26/17  1634     History   Chief Complaint Chief Complaint  Patient presents with  . GI Bleeding    HPI Allison Marshall is a 84 y.o. female.  HPI   Pt is an 84 y/o female with a h/o CVA, GIB, PUD, parkinson's disease, pressure ulcer of sacrum, who presents to the ED today with her daughters with concern of a possible GI bleed. Pts daughter's are at bedside and state the patient has been spitting up a watery brown fluid for the last 2 days.  Family has sample of sputum at bedside and it appears to be tinged with brown mucus.  They deny that she has had any significant cough or that she is coughing up this material.  They deny that she is vomiting up this material as well.  They state that she has otherwise been in her normal state of health at home.  She lives at home and has home health there 24/7.  The patient's daughters also rotate taking care of her throughout the week.  Patient has not had any other symptoms or complaints at home.  They state that she is able to voice when she is having symptoms.  Patient denies chest pain, shortness of breath, abdominal pain, or any other symptoms.  Family states that she has pressure ulcer to buttock that they have been taking care of.  No purulent drainage from the wound.  Patient also has chronic Foley catheter.  Nonambulatory at baseline.  Patient is on hospice.  She is a DNR.  Past Medical History:  Diagnosis Date  . GIB (gastrointestinal bleeding)   . Parkinson disease (HCC)   . PUD (peptic ulcer disease)     Patient Active Problem List   Diagnosis Date Noted  . Fatigue   . Acute cystitis without hematuria   . Bacteremia   . Sepsis due to Escherichia coli (HCC)   . CVA (cerebral vascular accident) (HCC) 03/21/2017  . Urinary obstruction 03/21/2017  . Leukocytosis 03/21/2017  . Community acquired pneumonia 03/21/2017  .  Hyperkalemia 03/21/2017  . Pressure ulcer of sacrum 03/21/2017  . Deep venous thrombosis (HCC)   . Difficulty speaking 03/02/2015  . PUD (peptic ulcer disease)   . Cerebral infarction due to unspecified mechanism   . Parkinson disease [facility])     Past Surgical History:  Procedure Laterality Date  . ESOPHAGOGASTRODUODENOSCOPY    . TEE WITHOUT CARDIOVERSION N/A 03/07/2015   Procedure: TRANSESOPHAGEAL ECHOCARDIOGRAM (TEE);  Surgeon: Wendall Stade, MD;  Location: [facility] ENDOSCOPY;  Service: Cardiovascular;  Laterality: N/A;     OB History   None      Home Medications    Prior to Admission medications   Medication Sig Start Date End Date Taking? Authorizing Provider  acetaminophen (TYLENOL) 325 MG tablet Take 325-650 mg by mouth every 6 (six) hours as needed for moderate pain.   Yes [provider]  LORazepam (ATIVAN) 0.5 MG tablet Take 0.5 mg by mouth every 4 (four) hours as needed for anxiety.   Yes [provider]  Multiple Vitamins-Minerals (MULTIVITAMIN ADULTS 50+ PO) Take 1 tablet by mouth daily.   Yes [provider]  oxycodone (OXY-IR) 5 MG capsule Take 5 mg by mouth every 4 (four) hours as needed for pain.   Yes [provider]  pantoprazole (PROTONIX) 40 MG tablet Take 40 mg by mouth daily.   Yes  [provider]  temazepam (RESTORIL) 15 MG capsule Take 15 mg by mouth at bedtime as needed for sleep.   Yes [provider]  levofloxacin (LEVAQUIN) 500 MG tablet Take 1 tablet (500 mg total) by mouth daily for 7 days. 08/26/17 09/02/17  Kevin Mario S, PA-C  pantoprazole (PROTONIX) 40 MG tablet Take 1 tablet (40 mg total) by mouth daily. Patient not taking: Reported on 03/20/2017 03/07/15   Leroy SeaSingh, Prashant K, MD  pravastatin (PRAVACHOL) 20 MG tablet Take 1 tablet (20 mg total) by mouth daily at 6 PM. Patient not taking: Reported on 03/20/2017 03/07/15   Leroy SeaSingh, Prashant K, MD  propranolol (INDERAL) 10 MG tablet Take 1 tablet (10 mg  total) by mouth every 12 (twelve) hours. Patient not taking: Reported on 08/26/2017 03/26/17   Penny PiaVega, [place], MD  Rivaroxaban 15 & 20 MG TBPK Take as directed on package: Start with one 15mg  tablet by mouth twice a day with food. On Day 22, switch to one 20mg  tablet once a day with food. Patient not taking: Reported on 08/26/2017 03/24/17   Penny PiaVega, Orlando, MD  sucralfate (CARAFATE) 1 g tablet Take 1 tablet (1 g total) by mouth 3 (three) times daily with meals for 14 days. 08/26/17 09/09/17  Lavaya Defreitas S, PA-C    Family History Family History  Problem Relation Age of Onset  . Lung cancer Mother   . Diabetes Mother   . Pancreatic cancer Father     Social History Social History   Tobacco Use  . Smoking status: Never Smoker  . Smokeless tobacco: Never Used  Substance Use Topics  . Alcohol use: No  . Drug use: Not on file     Allergies   Influenza vac split [flu virus vaccine]   Review of Systems Review of Systems  Constitutional: Negative for fever.  HENT: Negative for congestion and rhinorrhea.   Eyes: Negative for visual disturbance.  Respiratory: Positive for cough. Negative for shortness of breath and wheezing.   Cardiovascular: Negative for chest pain.  Gastrointestinal: Negative for abdominal pain, constipation, diarrhea, nausea and vomiting.  Genitourinary: Negative for hematuria.  Musculoskeletal: Negative for neck pain.  Skin: Positive for wound.  Neurological: Negative for headaches.   Physical Exam Updated Vital Signs BP 90/64 (BP Location: Left Arm)   Pulse 88   Temp 98.8 F (37.1 C) (Oral)   Resp 14   SpO2 94%   Physical Exam  Constitutional: No distress.  Appears frail and thin, nontoxic appearing. No acute distress.  HENT:  Head: Normocephalic and atraumatic.  Dry mucous membranes  Eyes: Conjunctivae and EOM are normal.  Neck: Neck supple.  Cardiovascular: Normal rate, regular rhythm, normal heart sounds and intact distal pulses.  No murmur  heard. Pulmonary/Chest: Effort normal and breath sounds normal. No stridor. No respiratory distress. She has no wheezes.  Abdominal: Soft. Bowel sounds are normal. She exhibits no distension. There is no tenderness. There is no guarding.  Musculoskeletal: She exhibits no edema.  Neurological: She is alert.  Answers yes/no questions appropriately  Skin: Skin is warm and dry. Capillary refill takes less than 2 seconds.  Psychiatric: She has a normal mood and affect.  Nursing note and vitals reviewed.  ED Treatments / Results  Labs (all labs ordered are listed, but only abnormal results are displayed) Labs Reviewed  COMPREHENSIVE METABOLIC PANEL - Abnormal; Notable for the following components:      Result Value   Potassium 3.4 (*)    Glucose, Bld 154 (*)  BUN 35 (*)    Albumin 3.2 (*)    Anion gap 17 (*)    All other components within normal limits  CBC - Abnormal; Notable for the following components:   WBC 11.5 (*)    All other components within normal limits  URINALYSIS, ROUTINE W REFLEX MICROSCOPIC - Abnormal; Notable for the following components:   APPearance TURBID (*)    Hgb urine dipstick SMALL (*)    Ketones, ur 20 (*)    Protein, ur 100 (*)    Leukocytes, UA MODERATE (*)    WBC, UA >50 (*)    Bacteria, UA FEW (*)    All other components within normal limits  URINE CULTURE  POC OCCULT BLOOD, ED  TYPE AND SCREEN  ABO/RH    EKG None  Radiology Dg Chest Portable 1 View  Result Date: 08/26/2017 CLINICAL DATA:  Patient has been spitting up black watery fluid. EXAM: PORTABLE CHEST 1 VIEW COMPARISON:  03/20/2017 CT cap CXR 03/20/2017. FINDINGS: Cardiomegaly with aortic atherosclerosis. Bibasilar atelectasis. Large hiatal hernia is noted superimposed upon the cardiac silhouette. Pulmonary consolidation. Minimal blunting the left lateral costophrenic angle is noted that may reflect a tiny left effusion. No acute osseous abnormality. IMPRESSION: Large hiatal hernia  superimposed on cardiomegaly. Moderate aortic atherosclerosis. No active pulmonary disease. Electronically Signed   By: Tollie Eth M.D.   On: 08/26/2017 18:51    Procedures Procedures (including critical care time)  Medications Ordered in ED Medications - No data to display   Initial Impression / Assessment and Plan / ED Course  I have reviewed the triage vital signs and the nursing notes.  Pertinent labs & imaging results that were available during my care of the patient were reviewed by me and considered in my medical decision making (see chart for details).    Discussed pt presentation and exam findings with Dr.Yelverton, who personally evaluated the patient and recommends starting the patient on Levaquin to cover urinary tract infection and a potential pneumonia.  Advised to give Carafate and have patient follow-up with GI as an outpatient.   Final Clinical Impressions(s) / ED Diagnoses   Final diagnoses:  Urinary tract infection with hematuria, site unspecified  Hiatal hernia   Patient presenting with her daughters to be evaluated for dark sputum production.  Sputum is somewhat brown on exam.  No coffee-ground emesis.  No bright red blood.  Patient's vital signs are grossly stable, upon discharge her blood pressure was 90/64 systolic when she was sleeping and after she had been given Ativan by her family.  She had normal pressures throughout the entirety of her visit other than this.     Labs showed a mild leukocytosis.  Her BUN is elevated at 35 but her creatinine is normal.  She has normal liver enzymes and her electrolytes are grossly at baseline.  She has a mildly elevated anion gap.  She has evidence of a urinary tract infection.  Urine was sent for culture.  She had no evidence of pneumonia on her chest x-ray however she did have a large hiatal hernia. Given her hiatal hernia she may be at risk for aspiration pneumonia, and therefore she will be started on Levaquin to cover both  urinary tract infection and possible pneumonia. Less likely GI bleed given patient had negative point-of-care Hemoccult, and her hemoglobin is normal. Also have lower suspicion for PE given her normal O2 sats and lac of chest pain.  As above patient given Rx for Levaquin.  Also given Rx for Carafate.  Gave referral to gastroenterology for follow-up and advised the patient's family at bedside to monitor her for any new or worsening symptoms.  Advised them to have her follow-up with her PCP in the next several days for reevaluation and to return to the ER if she has any new or worsening symptoms in the meantime.  All questions were answered and patient family at bedside is comfortable with the plan for discharge.  ED Discharge Orders        Ordered    levofloxacin (LEVAQUIN) 500 MG tablet  Daily     08/26/17 2232    sucralfate (CARAFATE) 1 g tablet  3 times daily with meals     08/26/17 2350       Zynasia Burklow S, PA-C 08/27/17 0125    Loren Racer, MD 08/27/17 2340

## 2017-08-26 NOTE — ED Triage Notes (Signed)
Per EMS: Pt reports she has been spitting up a watery black fluid. Family did not save any of the fluid, so EMS did not get a visual.  Pt is a hospice pt.  Pt denies pain. Pt reports the spitting up started yesterday during the day.  Pt is alert and oriented per EMS.

## 2017-08-26 NOTE — ED Notes (Signed)
Bed: WA03 Expected date:  Expected time:  Means of arrival:  Comments: EMS- 84yo F, spitting up "black watery fluid"

## 2017-08-26 NOTE — ED Notes (Signed)
PA at bedside.

## 2017-08-29 LAB — URINE CULTURE

## 2017-08-30 ENCOUNTER — Telehealth: Payer: Self-pay | Admitting: *Deleted

## 2017-08-30 NOTE — Telephone Encounter (Signed)
Attempted to conduct symptom check related to ED visit on 07/09/219.  No answer, left message to call back

## 2017-08-30 NOTE — Progress Notes (Signed)
ED Antimicrobial Stewardship Positive Culture Follow Up   [name] is an 84 y.o. female who presented to [facility] on 08/26/2017 with a chief complaint of  Chief Complaint  Patient presents with  . GI Bleeding    Recent Results (from the past 720 hour(s))  Urine culture     Status: Abnormal   Collection Time: 08/26/17  6:43 PM  Result Value Ref Range Status   Specimen Description   Final    URINE, CLEAN CATCH Performed at [facility], [address] [place], [place] [address]    Special Requests   Final    NONE Performed at [facility], [address] [place], [place] [address]    Culture (A)  Final    >=100,000 COLONIES/mL METHICILLIN RESISTANT STAPHYLOCOCCUS AUREUS   Report Status 08/29/2017 FINAL  Final   Organism ID, Bacteria METHICILLIN RESISTANT STAPHYLOCOCCUS AUREUS (A)  Final      Susceptibility   Methicillin resistant staphylococcus aureus - MIC*    CIPROFLOXACIN >=8 RESISTANT Resistant     GENTAMICIN <=0.5 SENSITIVE Sensitive     NITROFURANTOIN <=16 SENSITIVE Sensitive     OXACILLIN >=4 RESISTANT Resistant     TETRACYCLINE <=1 SENSITIVE Sensitive     VANCOMYCIN <=0.5 SENSITIVE Sensitive     TRIMETH/SULFA <=10 SENSITIVE Sensitive     CLINDAMYCIN >=8 RESISTANT Resistant     RIFAMPIN <=0.5 SENSITIVE Sensitive     Inducible Clindamycin NEGATIVE Sensitive     * >=100,000 COLONIES/mL METHICILLIN RESISTANT STAPHYLOCOCCUS AUREUS    [x]  Treated with levaquin, organism resistant to prescribed antimicrobial []  Patient discharged originally without antimicrobial agent and treatment is now indicated  New antibiotic prescription: Check for UTI symptoms (none noted on original visit). Continue levaquin for pneumonia treatment  ED Provider: Sharen Hecklaudia Gibbons, PA   [name], Drake LeachRachel Lynn 08/30/2017, 9:45 AM Clinical Pharmacist Monday - Friday phone -  [phone] Saturday - Sunday phone - [phone]

## 2019-06-13 IMAGING — CT CT HEAD W/O CM
4 series · 17 of 47 positions shown, 19 images · non-contrast
Comparison: 03/02/2015

CLINICAL DATA: Altered level of consciousness.

EXAM:
CT HEAD WITHOUT CONTRAST
TECHNIQUE: Contiguous axial images were obtained from the base of the skull
through the vertex without intravenous contrast.

[Series 3: head wo · axial · 0.42mm/px · z∈[-162,-32]mm · 7 of 36 slices shown, 9 images]
[im 5/36  brain]
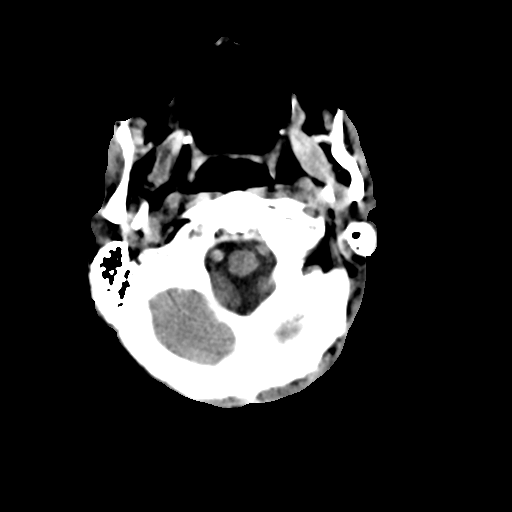
[im 5/36  bone]
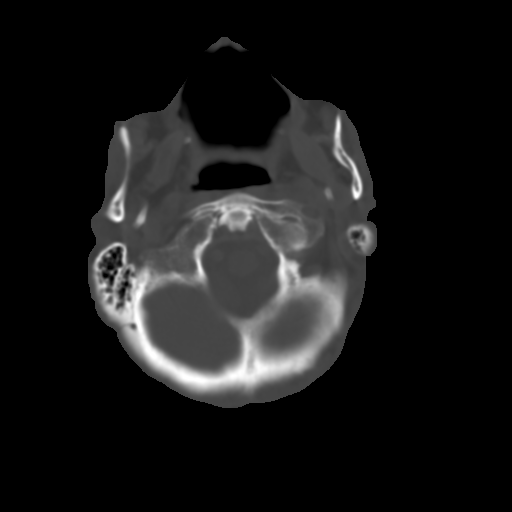
[im 9/36  brain]
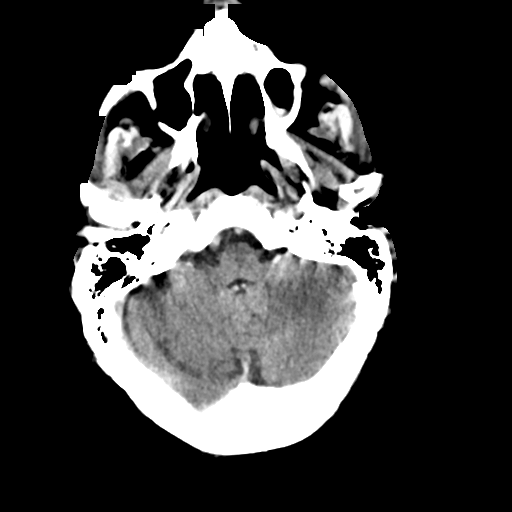
[im 14/36  brain]
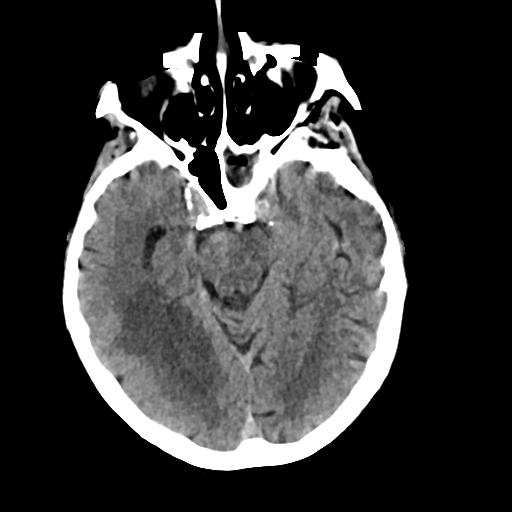
[im 18/36  brain]
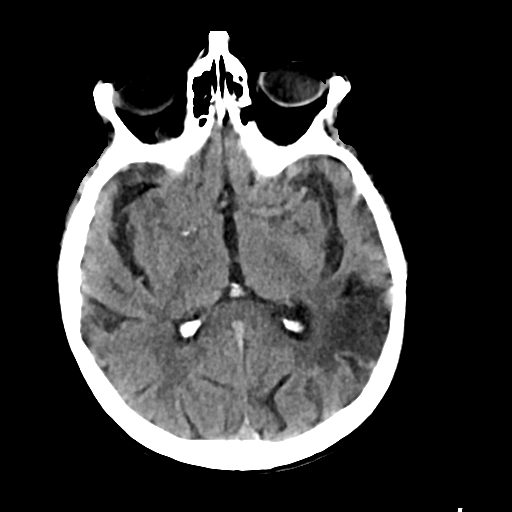
[im 22/36  brain]
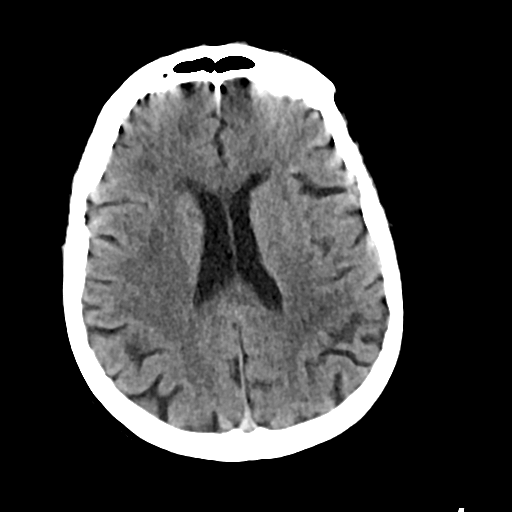
[im 22/36  bone]
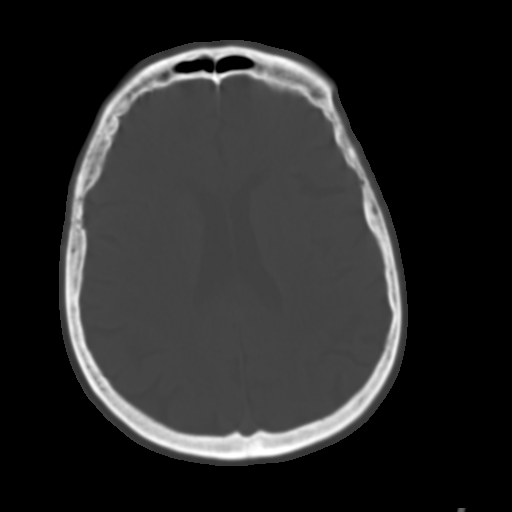
[im 27/36  brain]
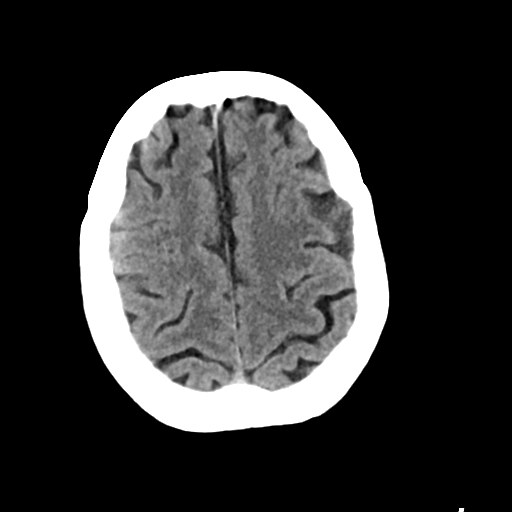
[im 31/36  brain]
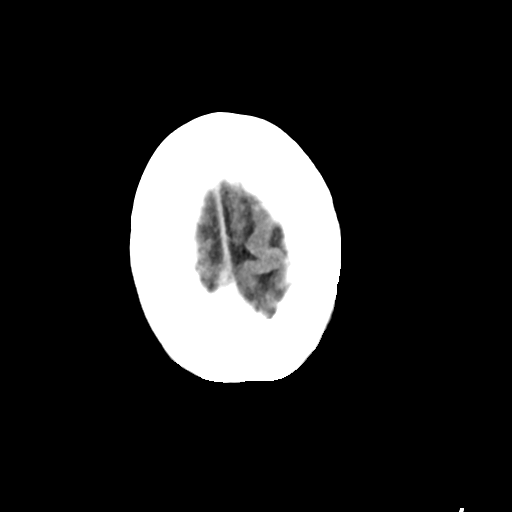

[Series 4: head bone · axial · 0.42mm/px · z∈[-166,-102]mm · 4 of 90 slices shown]
[im 9/90  bone]
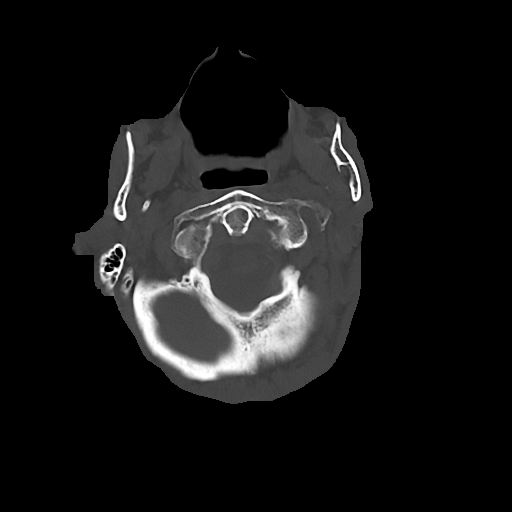
[im 18/90  bone]
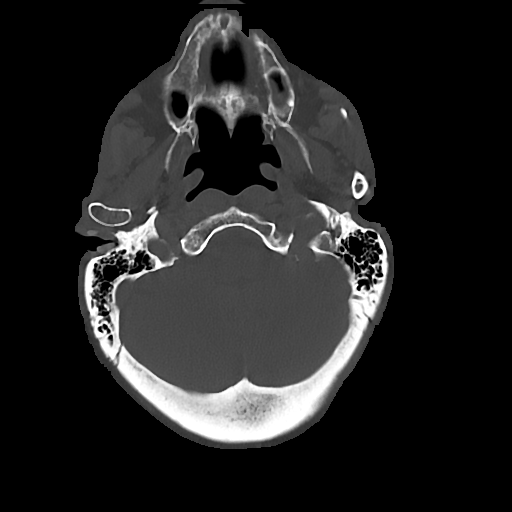
[im 27/90  bone]
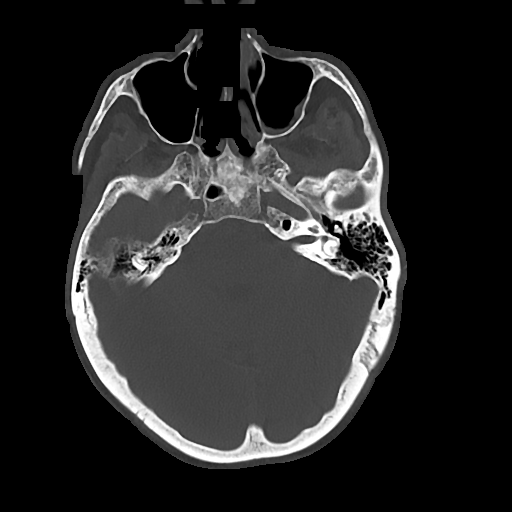
[im 41/90  bone]
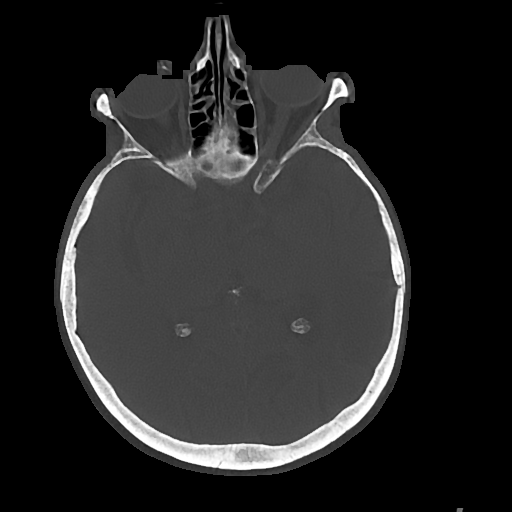

[Series 5: cor soft · coronal · 0.36mm/px · 3 of 70 slices shown]
[im 24/70  brain]
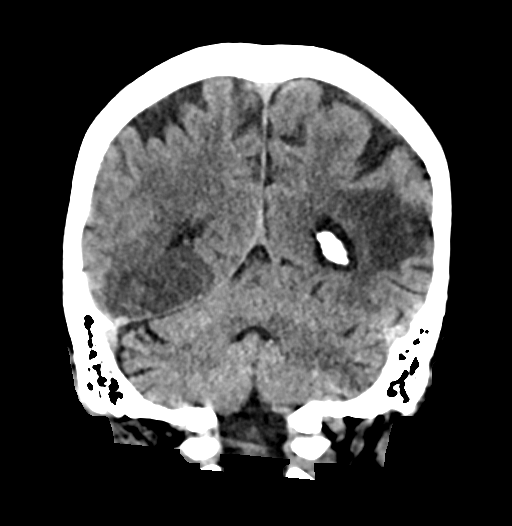
[im 31/70  brain]
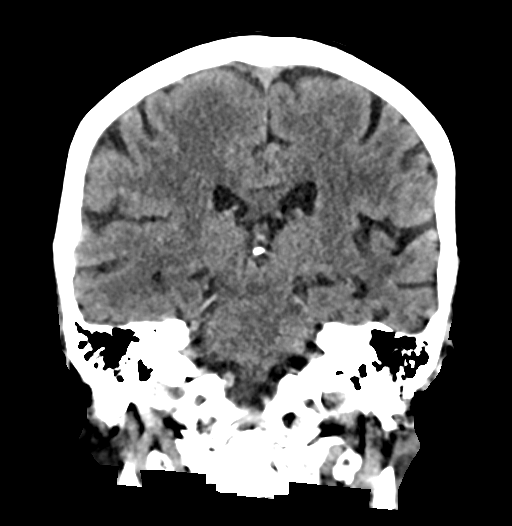
[im 39/70  brain]
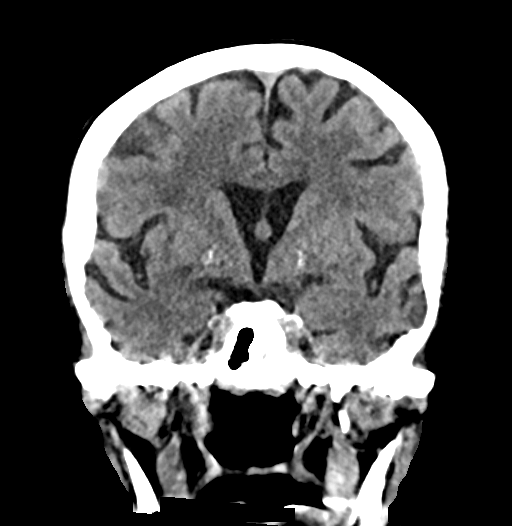

[Series 6: sag soft · sagittal · 0.35mm/px · 3 of 60 slices shown]
[im 20/60  brain]
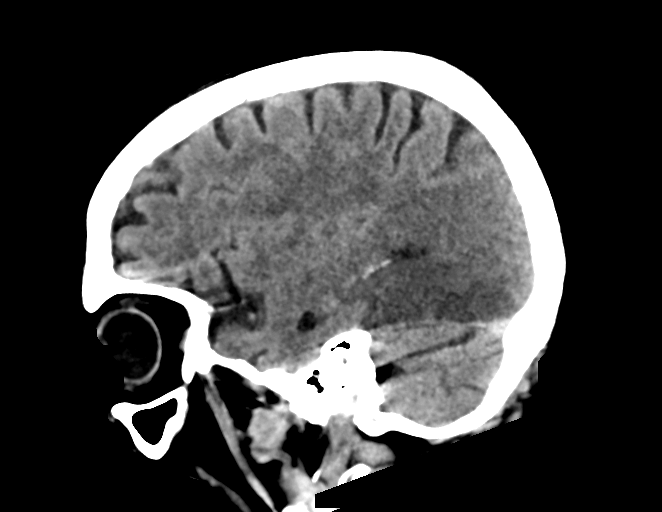
[im 30/60  brain]
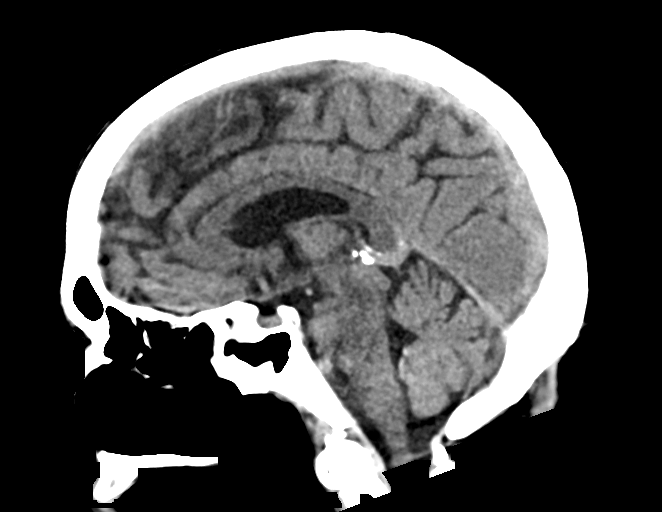
[im 40/60  brain]
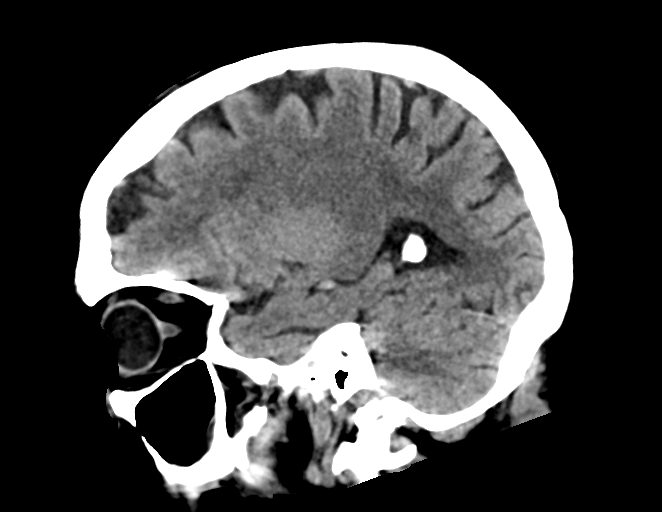

[17 of 47 positions shown; findings below may reference images not displayed]

FINDINGS: Brain: Infarct again noted in the left posterior parietal lobe as
seen on prior CT and MRI. New area of low-density noted in the right
occipital and posterior temporal lobes compatible with acute to
subacute infarct. No hemorrhage. No hydrocephalus.

Vascular: No hyperdense vessel or unexpected calcification.

Skull: No acute calvarial abnormality.

Sinuses/Orbits: Near complete opacification of the left sphenoid
sinus. Mucosal thickening throughout the remainder of the paranasal
sinuses. Mastoids are clear.

Other: None
IMPRESSION: Stable left posterior parietal infarct. New acute to subacute
infarct in the right occipital and posterior temporal lobes.

## 2019-11-19 IMAGING — DX DG CHEST 1V PORT
1 series · 1 of 1 positions shown · non-contrast
Comparison: 03/20/2017 CT cap CXR 03/20/2017.

CLINICAL DATA: Patient has been spitting up black watery fluid.

EXAM:
PORTABLE CHEST 1 VIEW

[chest ap]
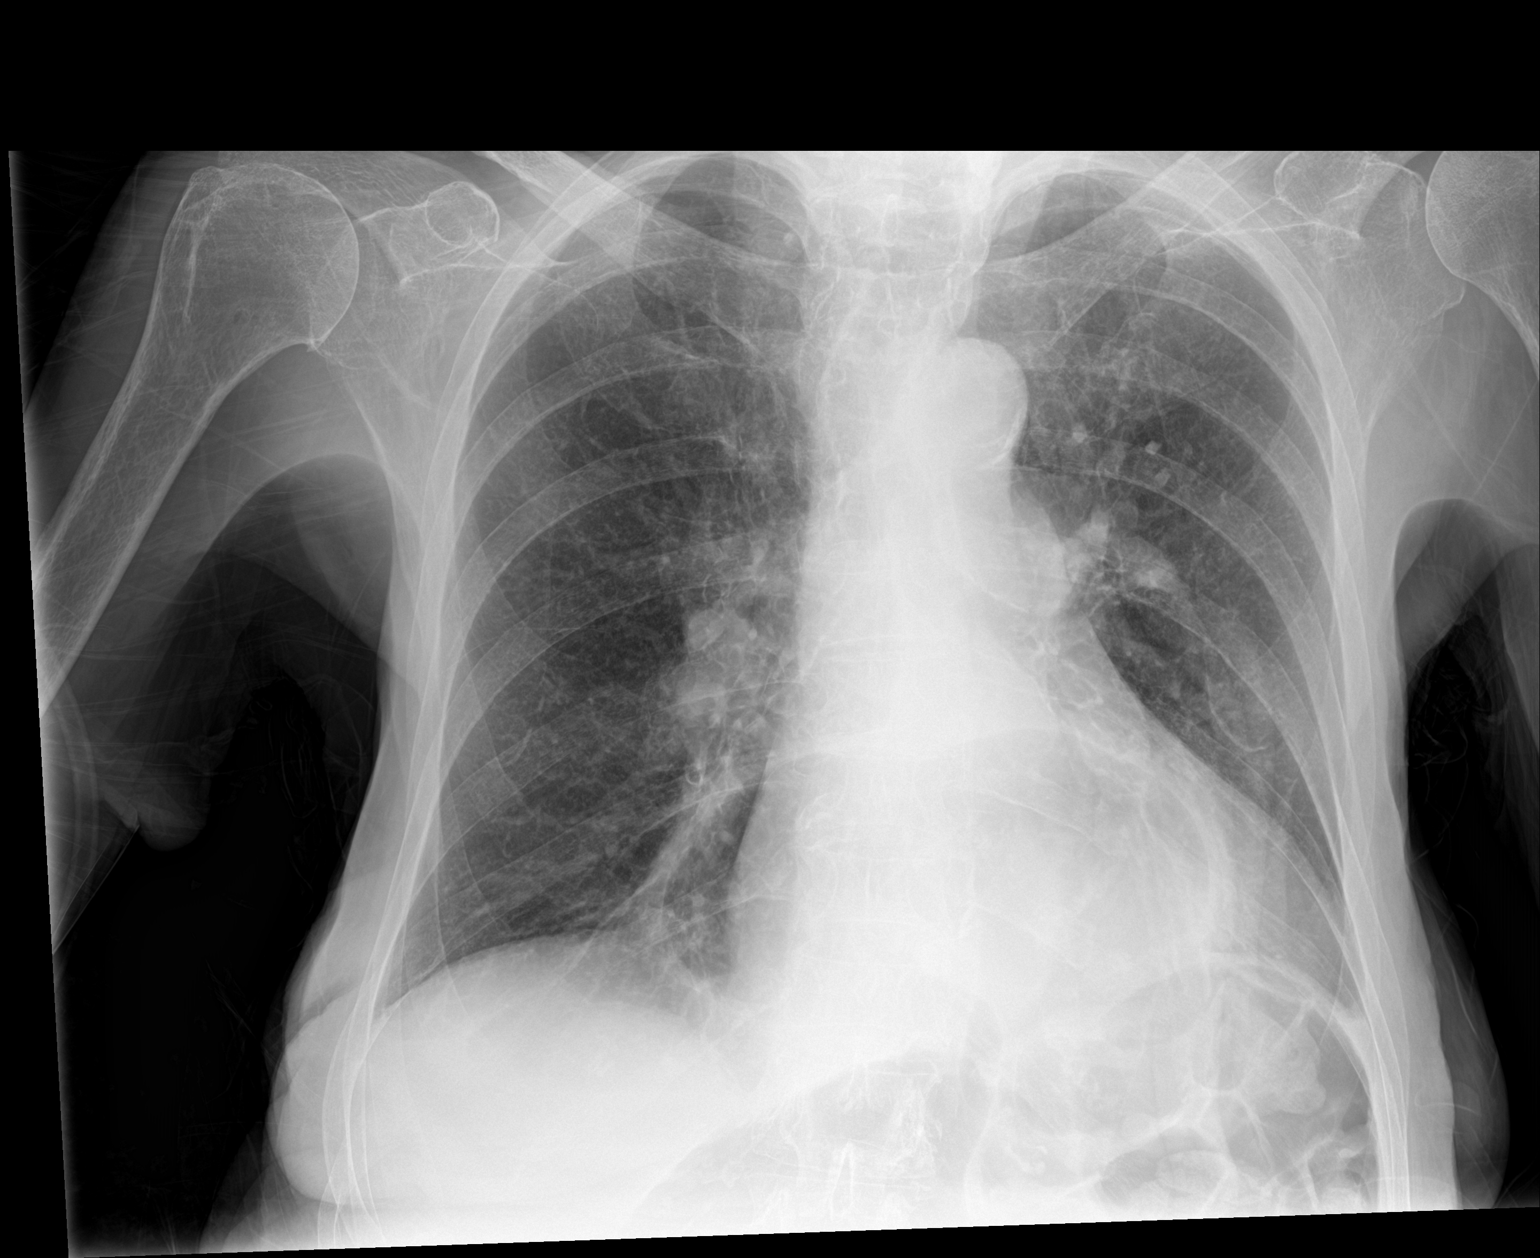

[1 of 1 positions shown; findings below may reference images not displayed]

FINDINGS: Cardiomegaly with aortic atherosclerosis. Bibasilar atelectasis.
Large hiatal hernia is noted superimposed upon the cardiac
silhouette. Pulmonary consolidation. Minimal blunting the left
lateral costophrenic angle is noted that may reflect a tiny left
effusion. No acute osseous abnormality.
IMPRESSION: Large hiatal hernia superimposed on cardiomegaly. Moderate aortic
atherosclerosis. No active pulmonary disease.
